# Patient Record
Sex: Male | Born: 1975 | Race: Black or African American | Hispanic: No | Marital: Single | Smoking: Former smoker
Health system: Southern US, Community
[De-identification: ages and names within clinical notes are randomized; demographics above are authoritative.]

## PROBLEM LIST (undated history)

## (undated) ENCOUNTER — Ambulatory Visit: Admission: EM | Payer: Self-pay | Source: Home / Self Care

## (undated) DIAGNOSIS — K219 Gastro-esophageal reflux disease without esophagitis: Secondary | ICD-10-CM

## (undated) DIAGNOSIS — I10 Essential (primary) hypertension: Secondary | ICD-10-CM

## (undated) DIAGNOSIS — Z8711 Personal history of peptic ulcer disease: Secondary | ICD-10-CM

## (undated) DIAGNOSIS — R42 Dizziness and giddiness: Secondary | ICD-10-CM

## (undated) DIAGNOSIS — R0600 Dyspnea, unspecified: Secondary | ICD-10-CM

## (undated) HISTORY — DX: Dizziness and giddiness: R42

## (undated) HISTORY — DX: Gastro-esophageal reflux disease without esophagitis: K21.9

## (undated) HISTORY — PX: MANDIBLE FRACTURE SURGERY: SHX706

## (undated) HISTORY — DX: Essential (primary) hypertension: I10

## (undated) HISTORY — DX: Dyspnea, unspecified: R06.00

## (undated) HISTORY — DX: Personal history of peptic ulcer disease: Z87.11

## (undated) HISTORY — PX: FRACTURE SURGERY: SHX138

---

## 2001-08-22 ENCOUNTER — Emergency Department (HOSPITAL_COMMUNITY): Admission: EM | Admit: 2001-08-22 | Discharge: 2001-08-22 | Payer: Self-pay | Admitting: Emergency Medicine

## 2002-10-19 ENCOUNTER — Emergency Department (HOSPITAL_COMMUNITY): Admission: EM | Admit: 2002-10-19 | Discharge: 2002-10-19 | Payer: Self-pay

## 2004-08-22 ENCOUNTER — Emergency Department (HOSPITAL_COMMUNITY): Admission: EM | Admit: 2004-08-22 | Discharge: 2004-08-22 | Payer: Self-pay | Admitting: Emergency Medicine

## 2004-10-31 ENCOUNTER — Ambulatory Visit: Payer: Self-pay | Admitting: Internal Medicine

## 2005-04-27 ENCOUNTER — Emergency Department (HOSPITAL_COMMUNITY): Admission: EM | Admit: 2005-04-27 | Discharge: 2005-04-27 | Payer: Self-pay | Admitting: Family Medicine

## 2008-03-25 ENCOUNTER — Emergency Department (HOSPITAL_COMMUNITY): Admission: EM | Admit: 2008-03-25 | Discharge: 2008-03-25 | Payer: Self-pay | Admitting: Emergency Medicine

## 2009-02-26 ENCOUNTER — Emergency Department (HOSPITAL_COMMUNITY): Admission: EM | Admit: 2009-02-26 | Discharge: 2009-02-26 | Payer: Self-pay | Admitting: Emergency Medicine

## 2009-09-04 ENCOUNTER — Emergency Department (HOSPITAL_COMMUNITY): Admission: EM | Admit: 2009-09-04 | Discharge: 2009-09-04 | Payer: Self-pay | Admitting: Family Medicine

## 2010-01-29 ENCOUNTER — Ambulatory Visit: Payer: Self-pay | Admitting: Internal Medicine

## 2010-01-29 ENCOUNTER — Observation Stay (HOSPITAL_COMMUNITY): Admission: EM | Admit: 2010-01-29 | Discharge: 2010-01-31 | Payer: Self-pay | Admitting: Emergency Medicine

## 2010-01-30 ENCOUNTER — Encounter (INDEPENDENT_AMBULATORY_CARE_PROVIDER_SITE_OTHER): Payer: Self-pay | Admitting: Internal Medicine

## 2010-02-06 ENCOUNTER — Telehealth (INDEPENDENT_AMBULATORY_CARE_PROVIDER_SITE_OTHER): Payer: Self-pay | Admitting: *Deleted

## 2010-02-07 ENCOUNTER — Ambulatory Visit: Payer: Self-pay | Admitting: Cardiology

## 2010-02-07 ENCOUNTER — Ambulatory Visit: Payer: Self-pay

## 2010-02-07 ENCOUNTER — Encounter (HOSPITAL_COMMUNITY): Admission: RE | Admit: 2010-02-07 | Discharge: 2010-04-12 | Payer: Self-pay | Admitting: Internal Medicine

## 2010-02-16 ENCOUNTER — Encounter: Payer: Self-pay | Admitting: Internal Medicine

## 2010-02-28 ENCOUNTER — Telehealth: Payer: Self-pay | Admitting: Internal Medicine

## 2010-03-07 ENCOUNTER — Ambulatory Visit: Payer: Self-pay | Admitting: Internal Medicine

## 2010-03-07 ENCOUNTER — Encounter (INDEPENDENT_AMBULATORY_CARE_PROVIDER_SITE_OTHER): Payer: Self-pay | Admitting: Nurse Practitioner

## 2010-03-07 DIAGNOSIS — I10 Essential (primary) hypertension: Secondary | ICD-10-CM | POA: Insufficient documentation

## 2010-03-07 DIAGNOSIS — R0602 Shortness of breath: Secondary | ICD-10-CM | POA: Insufficient documentation

## 2010-03-07 DIAGNOSIS — R079 Chest pain, unspecified: Secondary | ICD-10-CM | POA: Insufficient documentation

## 2010-03-09 ENCOUNTER — Ambulatory Visit: Payer: Self-pay | Admitting: Internal Medicine

## 2010-04-05 ENCOUNTER — Encounter (INDEPENDENT_AMBULATORY_CARE_PROVIDER_SITE_OTHER): Payer: Self-pay | Admitting: *Deleted

## 2010-04-19 ENCOUNTER — Encounter (INDEPENDENT_AMBULATORY_CARE_PROVIDER_SITE_OTHER): Payer: Self-pay | Admitting: *Deleted

## 2010-04-25 ENCOUNTER — Emergency Department (HOSPITAL_COMMUNITY): Admission: EM | Admit: 2010-04-25 | Discharge: 2010-04-25 | Payer: Self-pay | Admitting: Emergency Medicine

## 2010-05-18 ENCOUNTER — Encounter (INDEPENDENT_AMBULATORY_CARE_PROVIDER_SITE_OTHER): Payer: Self-pay | Admitting: *Deleted

## 2010-05-19 ENCOUNTER — Encounter (INDEPENDENT_AMBULATORY_CARE_PROVIDER_SITE_OTHER): Payer: Self-pay | Admitting: *Deleted

## 2010-07-19 ENCOUNTER — Encounter: Payer: Self-pay | Admitting: Internal Medicine

## 2010-07-19 ENCOUNTER — Ambulatory Visit (HOSPITAL_COMMUNITY): Admission: RE | Admit: 2010-07-19 | Discharge: 2010-07-19 | Payer: Self-pay | Admitting: Internal Medicine

## 2010-07-19 ENCOUNTER — Ambulatory Visit: Payer: Self-pay | Admitting: Cardiovascular Disease

## 2010-07-19 ENCOUNTER — Ambulatory Visit: Payer: Self-pay

## 2010-07-19 ENCOUNTER — Ambulatory Visit: Payer: Self-pay | Admitting: Internal Medicine

## 2010-08-11 ENCOUNTER — Emergency Department (HOSPITAL_COMMUNITY): Admission: EM | Admit: 2010-08-11 | Discharge: 2010-08-12 | Payer: Self-pay | Admitting: Emergency Medicine

## 2010-08-17 ENCOUNTER — Ambulatory Visit (HOSPITAL_BASED_OUTPATIENT_CLINIC_OR_DEPARTMENT_OTHER): Admission: RE | Admit: 2010-08-17 | Discharge: 2010-08-17 | Payer: Self-pay | Admitting: Orthopedic Surgery

## 2010-08-28 ENCOUNTER — Encounter
Admission: RE | Admit: 2010-08-28 | Discharge: 2010-10-17 | Payer: Self-pay | Source: Home / Self Care | Admitting: Orthopedic Surgery

## 2010-11-24 ENCOUNTER — Emergency Department (HOSPITAL_COMMUNITY)
Admission: EM | Admit: 2010-11-24 | Discharge: 2010-11-24 | Payer: Self-pay | Source: Home / Self Care | Admitting: Emergency Medicine

## 2010-12-12 NOTE — Letter (Signed)
Summary: Appointment - Reschedule  [facility]     [place], [place]    Phone:   Fax:      Apr 05, 2010 MRN: [id number]   Carl Greene [address] [place], [place]  [address]   Dear Mr. Carl Greene,   Due to a change in our office schedule, your appointment on  07-03-2010  at 9:30           must be changed.  It is very important that we reach you to reschedule this appointment. We look forward to participating in your health care needs. Please contact us at the number listed above at your earliest convenience to reschedule this appointment.     Sincerely,      Lorne Skeens  [facility] Scheduling Team

## 2010-12-12 NOTE — Assessment & Plan Note (Signed)
Summary: [facility] F/U   Vital Signs:  Patient profile:   35 year old male Height:      66.50 inches Weight:      168 pounds BMI:     26.81 BSA:     1.87 Temp:     97.8 degrees F oral Pulse rate:   68 / minute Pulse rhythm:   regular Resp:     20 per minute BP sitting:   131 / 95  (left arm) Cuff size:   regular  Vitals Entered By: Levon Hedger (March 07, 2010 11:46 AM) CC: new establish/ [facility] hospital follow up....experienced some SOB after walking to work last week Is Patient Diabetic? No Pain Assessment Patient in pain? no       Does patient need assistance? Functional Status Self care Ambulation Normal   CC:  new establish/ [facility] hospital follow up....experienced some SOB after walking to work last week.  History of Present Illness:  Pt into the office for hospital f/u on January 29, 2010. missed f/u appt here on April 5th, 2011 Pt was admitted with Atypical Chest Pain - resolved, hypertension, tobacco use (full D/c reviewed) Previous Dx of htn but pt was NOT taking meds upon presentation to ER R/o for MI based on cardiac enzymes EKG suggestive of LVH seen by Dr. Gala Romney (cardiology) and echo done Pt has already been seen by [facility] as per discharge instructions no further chest pain since d/c    Habits & Providers  Alcohol-Tobacco-Diet     Alcohol drinks/day: 0     Tobacco Status: quit     Tobacco Counseling: to remain off tobacco products     Year Started: age 14  Exercise-Depression-Behavior     Drug Use: marijuanna  Allergies (verified): No Known Drug Allergies  Social History: Smoking Status:  quit Drug Use:  marijuanna  Review of Systems CV:  Denies chest pain or discomfort. Resp:  Denies cough. GI:  Denies abdominal pain, nausea, and vomiting.  Physical Exam  General:  alert.   Head:  bald head Lungs:  normal breath sounds.   Heart:  normal rate and regular rhythm.   Abdomen:  soft and non-tender.   Msk:  up to the  exam table Neurologic:  alert & oriented X3.   Skin:  color normal.   Psych:  Oriented X3.     Impression & Recommendations:  Problem # 1:  HYPERTENSION, BENIGN ESSENTIAL (ICD-401.1) will refill meds BP stable today His updated medication list for this problem includes:    Hydrochlorothiazide 25 Mg Tabs (Hydrochlorothiazide) .[name]... Take one (1) by mouth daily    Amlodipine Besylate 5 Mg Tabs (Amlodipine besylate) ..... One tablet by mouth daily  Problem # 2:  CHEST PAIN-UNSPECIFIED (ICD-786.50) hospital d/c reviewed pt to f/u with cardiology as scheduled  Complete Medication List: 1)  Aspirin 81 Mg Tbec (Aspirin) .... Take one tablet by mouth daily 2)  Hydrochlorothiazide 25 Mg Tabs (Hydrochlorothiazide) .... Take one (1) by mouth daily 3)  Amlodipine Besylate 5 Mg Tabs (Amlodipine besylate) .... One tablet by mouth daily   Patient Instructions: 1)  Keep your appointment with cardiology 2)  You will need to keep your appointment for an orange card - eligibility. 3)  Follow up in this office in June after you get your orange card. Prescriptions: HYDROCHLOROTHIAZIDE 25 MG TABS (HYDROCHLOROTHIAZIDE) Take one (1) by mouth daily  #30 x 1   Entered and Authorized by:   Lehman Prom FNP  Signed by:   Lehman Prom FNP on 03/07/2010   Method used:   Print then Give to Patient   RxID:   [id number] AMLODIPINE BESYLATE 5 MG TABS (AMLODIPINE BESYLATE) One tablet by mouth daily  #30 x 1   Entered and Authorized by:   Lehman Prom FNP   Signed by:   Lehman Prom FNP on 03/07/2010   Method used:   Print then Give to Patient   RxID:   [id number]    CXR  Procedure date:  01/29/2010  Findings:      no active disease  Echocardiogram  Procedure date:  01/30/2010  Findings:      normal LV size, normal systolic function 50-55%, wall motion normal, no regional wall motion abnormalities   CXR  Procedure date:  01/29/2010  Findings:      no active  disease  Echocardiogram  Procedure date:  01/30/2010  Findings:      normal LV size, normal systolic function 50-55%, wall motion normal, no regional wall motion abnormalities

## 2010-12-12 NOTE — Progress Notes (Signed)
Summary: returning call  Phone Note Call from Patient Call back at [phone]   Caller: Patient Reason for Call: Talk to Nurse Summary of Call: returning call.... received letter about test results Initial call taken by: Migdalia Dk,  February 28, 2010 9:35 AM  Follow-up for Phone Call        pt aware of results, f/u appt sch for 4/28 at 10:15 [facility], RN  February 28, 2010 10:51 AM

## 2010-12-12 NOTE — Assessment & Plan Note (Signed)
Summary: 9:00/ eph/ g   Visit Type:  Follow-up Primary Provider:  [facility]  CC:  no cardiac complaints.  History of Present Illness: Carl Greene is a 35 y/o male with h/o HTN and tobacco use.   Recently admitted to [facility] with atypcial CP. Ruled out for Mi with serial cardiac markers. ECHO EF 50-55% + LVH. F/U nuke study. walked 9:15 NO ECG changes. with EF 42% (probably underestimated) mild diaphragmatic attenuation. No ischemia.     Returns today to discuss the results of his test.  Feeling good. Back to all his activities without probelm. Working, Scientist, physiological. No further CP.  Taking meds as prescribed. No CP or SOB.  Quit smoking completely.   Current Medications (verified): 1)  Aspirin 81 Mg Tbec (Aspirin) .... Take One Tablet By Mouth Daily 2)  Hydrochlorothiazide 25 Mg Tabs (Hydrochlorothiazide) .... Take One (1) By Mouth Daily 3)  Amlodipine Besylate 5 Mg Tabs (Amlodipine Besylate) .... One Tablet By Mouth Daily  Allergies (verified): No Known Drug Allergies  Past History:  Past Medical History: 1. Chest pain    --ECHO EF 50-55%   --Nuke study 4/11: Walked 9:15 no ecg changes. EF 42%? Inferior attenuation no scar 2. Dyspnea 3. Lightheadedness w/ CP 4. HTN 5. Tobacoo use - quit 4/11  Review of Systems       As per HPI and past medical history; otherwise all systems negative.   Vital Signs:  Patient profile:   35 year old male Height:      68 inches Weight:      170 pounds BMI:     25.94 Pulse rate:   70 / minute BP sitting:   136 / 86  (right arm) Cuff size:   regular  Vitals Entered By: Hardin Negus, RMA (March 09, 2010 9:27 AM)  Physical Exam  General:  ZOX:WRUE active. well appearing. no resp difficulty HEENT: normal Neck: supple. no JVD. Carotids 2+ bilat; no bruits. No lymphadenopathy or thryomegaly appreciated. Cor: PMI nondisplaced. Regular rate & rhythm. No rubs, gallops, murmur. Lungs: clear Abdomen: soft, nontender, nondistended. No  hepatosplenomegaly. No bruits or masses. Good bowel sounds. Extremities: no cyanosis, clubbing, rash, edema Neuro: alert & orientedx3, cranial nerves grossly intact. moves all 4 extremities w/o difficulty. affect pleasant    Impression & Recommendations:  Problem # 1:  CHEST PAIN-UNSPECIFIED (ICD-786.50) CP resolved. Currently asymptomatic. We reviewed results of echo and nuke stude at length. I suspect inferior defect on nuke study is attneuation artifact. No evidence of ischemia by test or symtpomatically. Discussed possiblitity of cath but I have advised watchful waiting. If feeling fine will repeat echo in 4 months and re-evalaute. If CP recures will plan cath.  Problem # 2:  Hypertension BP much improved. Continue current regimen.  Problem # 3:  Tobacco use. Congratulated on quitting.   Other Orders: Echocardiogram (Echo)  Patient Instructions: 1)  Your physician has requested that you have an echocardiogram.  Echocardiography is a painless test that uses sound waves to create images of your heart. It provides your doctor with information about the size and shape of your heart and how well your heart's chambers and valves are working.  This procedure takes approximately one hour. There are no restrictions for this procedure.  Needs in 4 months 2)  Follow up in 4 months Prescriptions: AMLODIPINE BESYLATE 5 MG TABS (AMLODIPINE BESYLATE) One tablet by mouth daily  #30 x 6   Entered by:   Meredith Staggers, RN   Authorized by:  Dolores Patty, MD, [facility]   Signed by:   Meredith Staggers, RN on 03/09/2010   Method used:   Electronically to        [facility] [phone]* (retail)       [address]       [place], [place]  [address]       Ph: [phone]       Fax: [phone]   RxID:   [id number] HYDROCHLOROTHIAZIDE 25 MG TABS (HYDROCHLOROTHIAZIDE) Take one (1) by mouth daily  #30 x 6   Entered by:   Meredith Staggers, RN   Authorized by:   Dolores Patty, MD, [facility]   Signed  by:   Meredith Staggers, RN on 03/09/2010   Method used:   Electronically to        [facility] [phone]* (retail)       [address]       [place], [place]  [address]       Ph: [phone]       Fax: [phone]   RxID:   [id number]   Prevention & Chronic Care Immunizations   Influenza vaccine: Not documented    Tetanus booster: Not documented    Pneumococcal vaccine: Not documented  Other Screening   Smoking status: current  (03/07/2010)

## 2010-12-12 NOTE — Letter (Signed)
Summary: Appointment - Reschedule  [facility], Main Office  [address]   [place], [place] [address]   Phone: [phone]  Fax: [phone]     April 19, 2010 MRN: [id number]   Carl Greene [address] [place], [place]  [address]   Dear [name],   Due to a change in our office schedule, your appointment on  07/03/10 at 9:30 must be changed.  It is very important that we reach you to reschedule this appointment. We look forward to participating in your health care needs. Please contact us at the number listed above at your earliest convenience to reschedule this appointment.     Sincerely, [facility] Scheduling Team

## 2010-12-12 NOTE — Assessment & Plan Note (Signed)
Summary: Cardiolog Nuclear Study  Nuclear Med Background Indications for Stress Test: Evaluation for Ischemia, Post Hospital  Indications Comments: 01-29-10 CP (-) enzymes  History: Echo  History Comments: 03/11 ECHO EF 50-55% Daily Marajuana use  Symptoms: Chest Pain, Dizziness, DOE, Fatigue, Light-Headedness, SOB    Nuclear Pre-Procedure Cardiac Risk Factors: CVA, Hypertension, Smoker Caffeine/Decaff Intake: none NPO After: 10:00 PM Lungs: clear IV 0.9% NS with Angio Cath: 18g     IV Site: (R) AC IV Started by: Stanton Kidney EMT-P Chest Size (in) 40     Height (in): 68 Weight (lb): 172 BMI: 26.25 Tech Comments: This patient has a very abnormal EKG, Dr. Gala Romney consulted to be sure it wasn't a problem to stress this patient.   Nuclear Med Study 1 or 2 day study:  1 day     Stress Test Type:  Stress Reading MD:  Marca Ancona, MD     Referring MD:  D.Bensimhon Resting Radionuclide:  Technetium 75m Tetrofosmin     Resting Radionuclide Dose:  10.5 mCi  Stress Radionuclide:  Technetium 57m Tetrofosmin     Stress Radionuclide Dose:  32 mCi   Stress Protocol Exercise Time (min):  9:15 min     Max HR:  173 bpm     Predicted Max HR:  186 bpm  Max Systolic BP: 172 mm Hg     Percent Max HR:  93.01 %     METS: 10.50 Rate Pressure Product:  29528    Stress Test Technologist:  Milana Na EMT-P     Nuclear Technologist:  Burna Mortimer Deal RT-N  Rest Procedure  Myocardial perfusion imaging was performed at rest 45 minutes following the intravenous administration of Myoview Technetium 51m Tetrofosmin.  Stress Procedure  The patient exercised for 9:15. The patient stopped due to fatigue and denied any chest pain.  There were non specific ST-T wave changes.  Myoview was injected at peak exercise and myocardial perfusion imaging was performed after a brief delay.  QPS Raw Data Images:  Mild diaphragmatic attenuation.  Normal left ventricular size. Stress Images:  Basal inferior  perfusion defect Rest Images:  Basal inferior perfusion defect Subtraction (SDS):  Mild fixed basal inferior perfusion defect.  Transient Ischemic Dilatation:  1.03  (Normal <1.22)  Lung/Heart Ratio:  .24  (Normal <0.45)  Quantitative Gated Spect Images QGS EDV:  134 ml QGS ESV:  78 ml QGS EF:  42 % QGS cine images:  Mild global hypokinesis.    Overall Impression  Exercise Capacity: Good exercise capacity. BP Response: Normal blood pressure response. Clinical Symptoms: Fatigue, no chest pain.  ECG Impression: No significant ST segment change suggestive of ischemia. Overall Impression: Mild fixed basal inferior perfusion defect is likely diaphragmatic attenuation.  Suspect no ischemia or infarction.  Overall Impression Comments: Mild global LV systolic dysfunction suggestive of nonischemic cardiomyopathy.  Needs review of echo (echo reports EF 50-55%) and consideration of cardiac MRI.   Appended Document: Cardiolog Nuclear Study pls bring in for visit to discuss.  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

## 2010-12-12 NOTE — Assessment & Plan Note (Signed)
Summary: 4 MONTH ROV/SL   Primary Provider:  [facility]   History of Present Illness: Carl Greene is a 35 y/o male with h/o HTN and tobacco use.   Recently admitted to [facility] with atypcial CP. Ruled out for MI with serial cardiac markers. ECHO EF 50-55% + LVH. F/U nuke study. walked 9:15 NO ECG changes. with EF 42% (probably underestimated) mild diaphragmatic attenuation. No ischemia.     I saw him several months ago in f/u and was doing fine. We discussed cath but given that he was asymptomatic we decided to proceed with watchful waiting and possibly repeat echo.   Returns todayfor routine f/u.  Feeling good. Back to all his activities without probelm. Working, walking doing calesthenics. Has occasional mild CP. Back to smoking about 2 cigs/day.   Current Medications (verified): 1)  Aspirin 81 Mg Tbec (Aspirin) .... Take One Tablet By Mouth Daily 2)  Hydrochlorothiazide 25 Mg Tabs (Hydrochlorothiazide) .... Take One (1) By Mouth Daily 3)  Amlodipine Besylate 5 Mg Tabs (Amlodipine Besylate) .... One Tablet By Mouth Daily  Allergies: No Known Drug Allergies  Past History:  Past Medical History: Last updated: 03/09/2010 1. Chest pain    --ECHO EF 50-55%   --Nuke study 4/11: Walked 9:15 no ecg changes. EF 42%? Inferior attenuation no scar 2. Dyspnea 3. Lightheadedness w/ CP 4. HTN 5. Tobacoo use - quit 4/11  Review of Systems       As per HPI and past medical history; otherwise all systems negative.   Vital Signs:  Patient profile:   35 year old male Height:      68 inches Weight:      166 pounds Pulse rate:   60 / minute Pulse rhythm:   regular BP sitting:   132 / 84  (right arm)  Vitals Entered By: Jacquelin Hawking, CMA (July 19, 2010 4:20 PM)  Physical Exam  General:  thin active. well appearing. no resp difficulty HEENT: normal Neck: supple. no JVD. Carotids 2+ bilat; no bruits. No lymphadenopathy or thryomegaly appreciated. Cor: PMI nondisplaced. Regular rate &  rhythm. No rubs, gallops, murmur. Lungs: clear Abdomen: soft, nontender, nondistended. No hepatosplenomegaly. No bruits or masses. Good bowel sounds. Extremities: no cyanosis, clubbing, rash, edema Neuro: alert & orientedx3, cranial nerves grossly intact. moves all 4 extremities w/o difficulty. affect pleasant     Impression & Recommendations:  Problem # 1:  CHEST PAIN-UNSPECIFIED (ICD-786.50) I reviewed echo today EF 60%. No wall motion abnormalities. Doub significant cardiac issue here. Counseled him on need to stop smoking. F/u with PCP. Return as needed.  Problem # 2:  Tobacco use Counseled on need to stop smoking.  Patient Instructions: 1)  Your physician wants you to follow-up in:  9 months.  You will receive a reminder letter in the mail two months in advance. If you don't receive a letter, please call our office to schedule the follow-up appointment.

## 2010-12-12 NOTE — Letter (Signed)
Summary: Appointment - Reschedule  [facility]     [place], [place]    Phone:   Fax:      May 18, 2010 MRN: [id number]   AKIEM URIETA [address] [place], [place]  [address]   Dear Mr. PELZER,   Due to a change in our office schedule, your appointment on 07-03-2010  at 9:30             must be changed.  It is very important that we reach you to reschedule this appointment. We look forward to participating in your health care needs. Please contact us at the number listed above at your earliest convenience to reschedule this appointment.     Sincerely,      Lorne Skeens  [facility] Scheduling Team

## 2010-12-12 NOTE — Letter (Signed)
Summary: test results  [facility], Main Office  [address]   [place], [place] [address]   Phone: [phone]  Fax: [phone]        February 16, 2010 MRN: [id number]    Carl Greene [address] [place], [place]  [address]    Dear [name],  We have been unable to reach you by phone regarding your test results.  Please give our office at your earliest convenience call to receive your results.     Sincerely,  Meredith Staggers, RN  This letter has been electronically signed by your physician.

## 2010-12-12 NOTE — Letter (Signed)
Summary: PT INFORMATION SHEET  PT INFORMATION SHEET   Imported By: Arta Bruce 05/02/2010 14:56:29  _____________________________________________________________________  External Attachment:    Type:   Image     Comment:   External Document

## 2010-12-12 NOTE — Progress Notes (Signed)
Summary: Nuclear Pre-Procedure  Phone Note Outgoing Call   Call placed by: Milana Na, EMT-P,  February 06, 2010 4:14 PM Summary of Call: Reviewed information on Myoview Information Sheet (see scanned document for further details).  No working numbers.     Nuclear Med Background Indications for Stress Test: Evaluation for Ischemia, Post Hospital  Indications Comments: 01-29-10 CP (-) enzymes  History: Echo  History Comments: 03/11 ECHO EF 50-55% Daily Marajuana use  Symptoms: Chest Pain    Nuclear Pre-Procedure Cardiac Risk Factors: CVA, Hypertension, Smoker  Nuclear Med Study Referring MD:  D.Bensimhon

## 2010-12-12 NOTE — Miscellaneous (Signed)
Summary: Appointment Canceled  Appointment status changed to canceled by [employer] on 05/19/2010 10:23 AM.  Cancellation Comments --------------------- ECHO DX 786.05/SELF PAY  Appointment Information ----------------------- Appt Type:  CARDIOLOGY ANCILLARY VISIT      Date:  Monday, July 03, 2010      Time:  8:30 AM for 60 min   Urgency:  Routine   Made By:  Hoy Finlay Scheduler  To Visit:  [id number]    Reason:  ECHO DX 786.05/SELF PAY  Appt Comments ------------- -- 05/19/10 10:23: ([facility]) CANCELED -- ECHO DX 786.05/SELF PAY -- 03/09/10 10:05: ([facility]) BOOKED -- Routine CARDIOLOGY ANCILLARY VISIT at 07/03/2010 8:30 AM for 60 min ECHO DX 786.05/SELF PAY                                                                           PT. CX RSC TO 07/29/10@ 3:00

## 2011-01-25 LAB — BASIC METABOLIC PANEL
BUN: 5 mg/dL — ABNORMAL LOW (ref 6–23)
CO2: 31 mEq/L (ref 19–32)
Calcium: 9.1 mg/dL (ref 8.4–10.5)
Chloride: 102 mEq/L (ref 96–112)
Creatinine, Ser: 0.82 mg/dL (ref 0.4–1.5)
GFR calc Af Amer: >60 mL/min (ref >60)
GFR calc non Af Amer: >60 mL/min (ref >60)
Glucose, Bld: 103 mg/dL — ABNORMAL HIGH (ref 70–99)
Potassium: 4.4 mEq/L (ref 3.5–5.1)
Sodium: 138 mEq/L (ref 135–145)

## 2011-01-25 LAB — POCT HEMOGLOBIN-HEMACUE: Hemoglobin: 14.2 g/dL (ref 13.0–17.0)

## 2011-01-29 LAB — POCT URINALYSIS DIP (DEVICE)
Bilirubin Urine: NEGATIVE
Glucose, UA: NEGATIVE mg/dL
Hgb urine dipstick: NEGATIVE
Ketones, ur: NEGATIVE mg/dL
Nitrite: NEGATIVE
Protein, ur: NEGATIVE mg/dL
Specific Gravity, Urine: 1.02 (ref 1.005–1.030)
Urobilinogen, UA: 0.2 mg/dL (ref 0.0–1.0)
pH: 7.5 (ref 5.0–8.0)

## 2011-02-05 LAB — POCT CARDIAC MARKERS
CKMB, poc: 1.2 ng/mL (ref 1.0–8.0)
CKMB, poc: <1 ng/mL — ABNORMAL LOW (ref 1.0–8.0)
Myoglobin, poc: 28.4 ng/mL (ref 12–200)
Myoglobin, poc: 43.2 ng/mL (ref 12–200)
Troponin i, poc: <0.05 ng/mL (ref 0.00–0.09)
Troponin i, poc: <0.05 ng/mL (ref 0.00–0.09)

## 2011-02-05 LAB — CARDIAC PANEL(CRET KIN+CKTOT+MB+TROPI)
CK, MB: 1.8 ng/mL (ref 0.3–4.0)
CK, MB: 1.9 ng/mL (ref 0.3–4.0)
CK, MB: 1.9 ng/mL (ref 0.3–4.0)
Relative Index: 1.2 (ref 0.0–2.5)
Relative Index: 1.2 (ref 0.0–2.5)
Relative Index: 1.5 (ref 0.0–2.5)
Total CK: 124 U/L (ref 7–232)
Total CK: 155 U/L (ref 7–232)
Total CK: 156 U/L (ref 7–232)
Troponin I: 0.05 ng/mL (ref 0.00–0.06)
Troponin I: <0.01 ng/mL (ref 0.00–0.06)
Troponin I: <0.01 ng/mL (ref 0.00–0.06)

## 2011-02-05 LAB — POCT I-STAT, CHEM 8
BUN: 8 mg/dL (ref 6–23)
Calcium, Ion: 1.08 mmol/L — ABNORMAL LOW (ref 1.12–1.32)
Chloride: 106 mEq/L (ref 96–112)
Creatinine, Ser: 0.8 mg/dL (ref 0.4–1.5)
Glucose, Bld: 87 mg/dL (ref 70–99)
HCT: 44 % (ref 39.0–52.0)
Hemoglobin: 15 g/dL (ref 13.0–17.0)
Potassium: 4.6 mEq/L (ref 3.5–5.1)
Sodium: 140 mEq/L (ref 135–145)
TCO2: 30 mmol/L (ref 0–100)

## 2011-02-05 LAB — CBC
HCT: 37.5 % — ABNORMAL LOW (ref 39.0–52.0)
HCT: 41.5 % (ref 39.0–52.0)
Hemoglobin: 12.1 g/dL — ABNORMAL LOW (ref 13.0–17.0)
Hemoglobin: 13.3 g/dL (ref 13.0–17.0)
MCHC: 32 g/dL (ref 30.0–36.0)
MCHC: 32.2 g/dL (ref 30.0–36.0)
MCV: 84.4 fL (ref 78.0–100.0)
MCV: 84.9 fL (ref 78.0–100.0)
Platelets: 182 10*3/uL (ref 150–400)
Platelets: 209 10*3/uL (ref 150–400)
RBC: 4.42 MIL/uL (ref 4.22–5.81)
RBC: 4.92 MIL/uL (ref 4.22–5.81)
RDW: 14.6 % (ref 11.5–15.5)
RDW: 15 % (ref 11.5–15.5)
WBC: 5.2 10*3/uL (ref 4.0–10.5)
WBC: 5.8 10*3/uL (ref 4.0–10.5)

## 2011-02-05 LAB — DIFFERENTIAL
Basophils Absolute: 0.1 10*3/uL (ref 0.0–0.1)
Basophils Relative: 1 % (ref 0–1)
Eosinophils Absolute: 0.1 10*3/uL (ref 0.0–0.7)
Eosinophils Relative: 3 % (ref 0–5)
Lymphocytes Relative: 39 % (ref 12–46)
Lymphs Abs: 2.3 10*3/uL (ref 0.7–4.0)
Monocytes Absolute: 0.5 10*3/uL (ref 0.1–1.0)
Monocytes Relative: 8 % (ref 3–12)
Neutro Abs: 2.8 10*3/uL (ref 1.7–7.7)
Neutrophils Relative %: 49 % (ref 43–77)

## 2011-02-05 LAB — CK TOTAL AND CKMB (NOT AT ARMC)
CK, MB: 1.9 ng/mL (ref 0.3–4.0)
Relative Index: 1.1 (ref 0.0–2.5)
Total CK: 167 U/L (ref 7–232)

## 2011-02-05 LAB — LIPID PANEL
Cholesterol: 144 mg/dL (ref 0–200)
HDL: 32 mg/dL — ABNORMAL LOW (ref >39)
LDL Cholesterol: 100 mg/dL — ABNORMAL HIGH (ref 0–99)
Total CHOL/HDL Ratio: 4.5 RATIO
Triglycerides: 59 mg/dL (ref <150)
VLDL: 12 mg/dL (ref 0–40)

## 2011-02-05 LAB — TSH: TSH: 1.655 u[IU]/mL (ref 0.350–4.500)

## 2011-02-05 LAB — BASIC METABOLIC PANEL
BUN: 5 mg/dL — ABNORMAL LOW (ref 6–23)
CO2: 27 mEq/L (ref 19–32)
Calcium: 8.5 mg/dL (ref 8.4–10.5)
Chloride: 109 mEq/L (ref 96–112)
Creatinine, Ser: 0.72 mg/dL (ref 0.4–1.5)
GFR calc Af Amer: >60 mL/min (ref >60)
GFR calc non Af Amer: >60 mL/min (ref >60)
Glucose, Bld: 96 mg/dL (ref 70–99)
Potassium: 3.9 mEq/L (ref 3.5–5.1)
Sodium: 140 mEq/L (ref 135–145)

## 2011-02-05 LAB — D-DIMER, QUANTITATIVE: D-Dimer, Quant: <0.22 ug/mL-FEU (ref 0.00–0.48)

## 2011-02-05 LAB — TROPONIN I: Troponin I: 0.01 ng/mL (ref 0.00–0.06)

## 2011-04-10 ENCOUNTER — Encounter: Payer: Self-pay | Admitting: Internal Medicine

## 2011-04-23 ENCOUNTER — Ambulatory Visit (INDEPENDENT_AMBULATORY_CARE_PROVIDER_SITE_OTHER): Payer: Self-pay | Admitting: Internal Medicine

## 2011-04-23 ENCOUNTER — Encounter: Payer: Self-pay | Admitting: Internal Medicine

## 2011-04-23 VITALS — BP 134/92 | HR 60 | Ht 68.0 in | Wt 173.0 lb

## 2011-04-23 DIAGNOSIS — F17200 Nicotine dependence, unspecified, uncomplicated: Secondary | ICD-10-CM | POA: Insufficient documentation

## 2011-04-23 DIAGNOSIS — E049 Nontoxic goiter, unspecified: Secondary | ICD-10-CM | POA: Insufficient documentation

## 2011-04-23 DIAGNOSIS — I10 Essential (primary) hypertension: Secondary | ICD-10-CM

## 2011-04-23 DIAGNOSIS — R079 Chest pain, unspecified: Secondary | ICD-10-CM

## 2011-04-23 NOTE — Progress Notes (Signed)
HPI:  Carl Greene is a 35 y/o male with h/o HTN and tobacco use.   Admitted to [facility] 3/11 with atypcial CP. Ruled out for Mi with serial cardiac markers. ECHO EF 50-55% + LVH. F/U nuke study. walked 9:15 NO ECG changes. with EF 42% (probably underestimated) mild diaphragmatic attenuation. No ischemia.     Had repeat echo in 9/11 EF 55-60%.   Had been feeling well but recently has been having tingling and tightness in arm, legs and back. Comes on when he feels lightheaded. If he stands up quickly also gets lightheaded. No playing sports. Appetite ok. Rare CP. No orthopnea or PND.  Not taking meds routinely.   Under a lot of stress. Fatigued. Feels somewhat depressed. Currently doesn't have a job.   Smoking a couple cigs per day.   ROS: All systems negative except as listed in HPI, PMH and Problem List.  Past Medical History  Diagnosis Date  . Chest pain     Echo EF 50-55%. Nuke study 4/11: walked 9:15 no ecg changes. EF 42%?  inferoir attenuation no scar.   [name] Dyspnea   . Lightheadedness     with CP  . HTN (hypertension)     Current Outpatient Prescriptions  Medication Sig Dispense Refill  . aspirin 81 MG tablet Take 81 mg by mouth daily.        [name] amLODipine (NORVASC) 5 MG tablet Take 5 mg by mouth daily.        . hydrochlorothiazide 25 MG tablet Take 25 mg by mouth daily.           PHYSICAL EXAM: Filed Vitals:   04/23/11 1431  BP: 134/92  Pulse: 60   General:  Well appearing. No resp difficulty HEENT: normal Neck: supple. JVP flat. Carotids 2+ bilaterally; no bruits. No lymphadenopathy or thryomegaly appreciated. Cor: PMI normal. Regular rate & rhythm. No rubs, gallops or murmurs. Lungs: clear Abdomen: soft, nontender, nondistended. No hepatosplenomegaly. No bruits or masses. Good bowel sounds. Extremities: no cyanosis, clubbing, rash, edema Neuro: alert & orientedx3, cranial nerves grossly intact. Moves all 4 extremities w/o difficulty. Affect pleasant.    ECG: NSR 60  +LVH Diffuse TWI (old)   ASSESSMENT & PLAN:

## 2011-04-23 NOTE — Patient Instructions (Signed)
Stop HCTZ Start Amlodipine 5 mg daily  Labs today (bmet, liver, cbc, tsh, free t4, free t3)  Thyroid Ultrasound for a goiter  Your physician wants you to follow-up in: 6 months.  You will receive a reminder letter in the mail two months in advance. If you don't receive a letter, please call our office to schedule the follow-up appointment.

## 2011-04-23 NOTE — Assessment & Plan Note (Signed)
Echo looks good. I doubt symptoms are cardiac at this point. I worry hi stress is a major issue.

## 2011-04-23 NOTE — Assessment & Plan Note (Signed)
Appears to have prominent goiter on exam. Check thyroid u/s and full TFTs.

## 2011-04-23 NOTE — Assessment & Plan Note (Signed)
BP mildly elevated. Restart amlodipine. D/c HCTZ given orthostatic sx.

## 2011-04-23 NOTE — Assessment & Plan Note (Signed)
Counseled on need to stop smoking.  

## 2011-04-24 LAB — HEPATIC FUNCTION PANEL
ALT: 22 U/L (ref 0–53)
AST: 25 U/L (ref 0–37)
Albumin: 4.6 g/dL (ref 3.5–5.2)
Alkaline Phosphatase: 77 U/L (ref 39–117)
Bilirubin, Direct: 0.2 mg/dL (ref 0.0–0.3)
Total Bilirubin: 0.4 mg/dL (ref 0.3–1.2)
Total Protein: 7.2 g/dL (ref 6.0–8.3)

## 2011-04-24 LAB — BASIC METABOLIC PANEL
BUN: 8 mg/dL (ref 6–23)
CO2: 26 mEq/L (ref 19–32)
Calcium: 9 mg/dL (ref 8.4–10.5)
Chloride: 107 mEq/L (ref 96–112)
Creatinine, Ser: 0.8 mg/dL (ref 0.4–1.5)
GFR: 136.73 mL/min (ref >60.00)
Glucose, Bld: 89 mg/dL (ref 70–99)
Potassium: 4.4 mEq/L (ref 3.5–5.1)
Sodium: 138 mEq/L (ref 135–145)

## 2011-04-24 LAB — CBC WITH DIFFERENTIAL/PLATELET
Eosinophils Relative: 2.3 % (ref 0.0–5.0)
HCT: 40.6 % (ref 39.0–52.0)
Hemoglobin: 13.3 g/dL (ref 13.0–17.0)
Lymphs Abs: 2.5 10*3/uL (ref 0.7–4.0)
MCV: 85 fl (ref 78.0–100.0)
Monocytes Absolute: 0.3 10*3/uL (ref 0.1–1.0)
Monocytes Relative: 6.4 % (ref 3.0–12.0)
Neutro Abs: 2.3 10*3/uL (ref 1.4–7.7)
Platelets: 204 10*3/uL (ref 150.0–400.0)
RDW: 16.2 % — ABNORMAL HIGH (ref 11.5–14.6)
WBC: 5.2 10*3/uL (ref 4.5–10.5)

## 2011-04-24 LAB — T3, FREE: T3, Free: 2.9 pg/mL (ref 2.3–4.2)

## 2011-04-26 ENCOUNTER — Ambulatory Visit (HOSPITAL_COMMUNITY)
Admission: RE | Admit: 2011-04-26 | Discharge: 2011-04-26 | Disposition: A | Discharge status: Home / Self Care | Payer: Self-pay | Source: Ambulatory Visit | Attending: Internal Medicine | Admitting: Internal Medicine

## 2011-04-26 DIAGNOSIS — E049 Nontoxic goiter, unspecified: Secondary | ICD-10-CM | POA: Insufficient documentation

## 2011-05-08 ENCOUNTER — Telehealth: Payer: Self-pay | Admitting: Internal Medicine

## 2011-05-08 NOTE — Telephone Encounter (Signed)
Pt calling for ECHO results--cell phone will not take anymore messages--called pt's home number and spoke to pt's grandmother and asked her to tell pt heart test normal--grandmother agrees--nt

## 2011-05-08 NOTE — Telephone Encounter (Signed)
Test result

## 2011-05-09 NOTE — Telephone Encounter (Signed)
Pt given results of thyroid ultrasound

## 2011-08-13 ENCOUNTER — Other Ambulatory Visit: Payer: Self-pay | Admitting: Internal Medicine

## 2011-12-20 ENCOUNTER — Other Ambulatory Visit (HOSPITAL_COMMUNITY): Payer: Self-pay | Admitting: Internal Medicine

## 2012-06-23 ENCOUNTER — Encounter (HOSPITAL_COMMUNITY): Payer: Self-pay

## 2012-06-23 ENCOUNTER — Emergency Department (INDEPENDENT_AMBULATORY_CARE_PROVIDER_SITE_OTHER)
Admission: EM | Admit: 2012-06-23 | Discharge: 2012-06-23 | Disposition: A | Discharge status: Home / Self Care | Payer: Self-pay | Source: Home / Self Care | Attending: Emergency Medicine | Admitting: Emergency Medicine

## 2012-06-23 DIAGNOSIS — Z202 Contact with and (suspected) exposure to infections with a predominantly sexual mode of transmission: Secondary | ICD-10-CM

## 2012-06-23 DIAGNOSIS — Z9189 Other specified personal risk factors, not elsewhere classified: Secondary | ICD-10-CM

## 2012-06-23 DIAGNOSIS — I10 Essential (primary) hypertension: Secondary | ICD-10-CM

## 2012-06-23 DIAGNOSIS — R079 Chest pain, unspecified: Secondary | ICD-10-CM

## 2012-06-23 MED ORDER — AMLODIPINE BESYLATE 5 MG PO TABS: 5.0000 mg | ORAL_TABLET | Freq: Every day | ORAL | Status: DC

## 2012-06-23 NOTE — ED Provider Notes (Signed)
Chief Complaint  Patient presents with  . Follow-up    History of Present Illness:   The patient is a 36 year old male who comes in today for STD testing. He recently found out that his partner was diagnosed with herpes. He wants to be checked for HSV 1 and 2 and HIV. He denies any lesions of the penis, urethral discharge, or dysuria. He also has had high blood pressure for several years. He's been off of his meds since March. He has been on Norvasc 5 mg per day. Denies headaches, dizziness, blurry vision, chest pain, or shortness of breath. Dr. Milas Kocher is his doctor. He was hospitalized at [facility] under his care in 2011 and underwent a complete cardiac workup which was negative. He has had an EKG in the past which showed nonspecific T wave changes. He was also told that his thyroid was enlarged but on further testing this did not require any further treatment. He denies any recent chest pain, but he states he had a couple episodes last month. These lasted for a few minutes at a time. They were not exertional and not associated with shortness of breath, nausea, or diaphoresis.  Review of Systems:  Other than noted above, the patient denies any of the following symptoms: Systemic:  No fevers chills, aches, weight loss, arthralgias, myalgias, or adenopathy. GI:  No abdominal pain, nausea or vomiting. GU:  No dysuria, penile pain, discharge, itching, dysuria, genital lesions, testicular pain or swelling. Skin:  No rash or itching.  PMFSH:  Past medical history, family history, social history, meds, and allergies were reviewed.  Physical Exam:   Vital signs:  BP 145/95  Pulse 86  Temp 98.5 F (36.9 C) (Oral)  Resp 16  SpO2 100% Gen:  Alert, oriented, in no distress. Lungs: Clear to auscultation. Heart: Regular rhythm, no gallops or murmurs. Abdomen:  Soft and flat, non-distended, and non-tender.  No organomegaly or mass. Genital:  No lesions on the penis, no urethral discharge, testes were  normal. Skin:  Warm and dry.  No rash.    Date: 06/23/2012  Rate: 68  Rhythm: normal sinus rhythm  QRS Axis: normal  Intervals: normal  ST/T Wave abnormalities: nonspecific T wave changes  Conduction Disutrbances:none  Narrative Interpretation: Normal sinus rhythm, nonspecific T wave abnormality, prolonged QT interval. Reviewing his previous EKG from 2012 shows no changes.  Old EKG Reviewed: unchanged  Other Labs Obtained at Urgent Care Center:  GC and Chlamydia DNA probe was obtained as well as serologies for HIV, RPR and herpes simplex virus type I and 2.  Results are pending at this time and we will call about any positive results.  Assessment:  The primary encounter diagnosis was Potential exposure to STD. Diagnoses of HYPERTENSION, BENIGN ESSENTIAL and CHEST PAIN-UNSPECIFIED were also pertinent to this visit. His chest pain is atypical for cardiac disease, his EKG is unchanged, and he had a previous cardiac workup which was negative. I do not think that the chest pain is cardiac in nature.  Plan:   1.  The following meds were prescribed:   New Prescriptions   AMLODIPINE (NORVASC) 5 MG TABLET    Take 1 tablet (5 mg total) by mouth daily.   2.  The patient was instructed in symptomatic care and handouts were given. 3.  The patient was told to return if becoming worse in any way, if no better in 3 or 4 days, and given some red flag symptoms that would indicate earlier return. 4.  The patient was instructed to inform all sexual contacts, avoid intercourse completely for 2 weeks and then only with a condom.  The patient was told that we would call about all abnormal lab results, and that we would need to report certain kinds of infection to the health department.    Reuben Likes, MD 06/23/12 2017

## 2012-06-23 NOTE — ED Notes (Signed)
Here for multiple issues; his "baby mama" has been told she has herpes, and he wants to be checked, also has not had an HIV screen done in a while and it's time to have that done again. Has not had his BP pills for a few months, as he is out of work, and could not afford them. Wants lesions on arms checked too. NAD

## 2012-06-24 ENCOUNTER — Telehealth (HOSPITAL_COMMUNITY): Payer: Self-pay | Admitting: *Deleted

## 2012-06-24 LAB — RPR: RPR Ser Ql: NONREACTIVE

## 2012-06-24 LAB — GC/CHLAMYDIA PROBE AMP, GENITAL: Chlamydia, DNA Probe: NEGATIVE

## 2012-06-24 LAB — HIV ANTIBODY (ROUTINE TESTING W REFLEX): HIV: NONREACTIVE

## 2012-06-24 LAB — HSV 2 ANTIBODY, IGG: HSV 2 Glycoprotein G Ab, IgG: 8.08 IV — ABNORMAL HIGH

## 2012-06-24 NOTE — ED Notes (Signed)
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 I called and left a message to call on cell and home numbers. Carl Greene 06/24/2012

## 2012-06-24 NOTE — ED Notes (Addendum)
Pt. returned my call.  Pt. verified x 2 and given results. 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  Pt. States he has never had a sore but has had a rash with some itching. Pt. instructed to notify his partner. You can pass the virus even when you don't have an outbreak, so always practice safe sex. Get treated for each outbreak with Acyclovir or Valtrex. You may want to get a primary care doctor who can give you medication to help prevent outbreaks or who can call in a prescription for you when you have an outbreak.  Pt. voiced understanding.  Pt. Concerned about a pea sized cys on his posterior chest. It has been there for a few months and is not painful.  Discussed with Dr. Lorenz Coaster and he said to tell him to just leave it alone. Pt. given this information.  I told him if it gets red swollen painful or draining to get it checked. Vassie Moselle 06/24/2012

## 2013-03-18 ENCOUNTER — Emergency Department (HOSPITAL_COMMUNITY)
Admission: EM | Admit: 2013-03-18 | Discharge: 2013-03-18 | Disposition: A | Discharge status: Home / Self Care | Payer: Self-pay | Attending: Emergency Medicine | Admitting: Emergency Medicine

## 2013-03-18 ENCOUNTER — Encounter (HOSPITAL_COMMUNITY): Payer: Self-pay | Admitting: Family Medicine

## 2013-03-18 ENCOUNTER — Emergency Department (HOSPITAL_COMMUNITY): Payer: Self-pay

## 2013-03-18 DIAGNOSIS — R51 Headache: Secondary | ICD-10-CM | POA: Insufficient documentation

## 2013-03-18 DIAGNOSIS — F17200 Nicotine dependence, unspecified, uncomplicated: Secondary | ICD-10-CM | POA: Insufficient documentation

## 2013-03-18 DIAGNOSIS — I10 Essential (primary) hypertension: Secondary | ICD-10-CM | POA: Insufficient documentation

## 2013-03-18 DIAGNOSIS — Z79899 Other long term (current) drug therapy: Secondary | ICD-10-CM | POA: Insufficient documentation

## 2013-03-18 DIAGNOSIS — Z7982 Long term (current) use of aspirin: Secondary | ICD-10-CM | POA: Insufficient documentation

## 2013-03-18 DIAGNOSIS — R110 Nausea: Secondary | ICD-10-CM | POA: Insufficient documentation

## 2013-03-18 DIAGNOSIS — R079 Chest pain, unspecified: Secondary | ICD-10-CM | POA: Insufficient documentation

## 2013-03-18 LAB — CBC
HCT: 39.9 % (ref 39.0–52.0)
Hemoglobin: 14.3 g/dL (ref 13.0–17.0)
MCH: 27.6 pg (ref 26.0–34.0)
MCHC: 35.8 g/dL (ref 30.0–36.0)
RDW: 14.5 % (ref 11.5–15.5)

## 2013-03-18 LAB — URINALYSIS, ROUTINE W REFLEX MICROSCOPIC
Bilirubin Urine: NEGATIVE
Hgb urine dipstick: NEGATIVE
Ketones, ur: NEGATIVE mg/dL
Nitrite: NEGATIVE
Protein, ur: NEGATIVE mg/dL
Specific Gravity, Urine: 1.012 (ref 1.005–1.030)
Urobilinogen, UA: 0.2 mg/dL (ref 0.0–1.0)

## 2013-03-18 LAB — BASIC METABOLIC PANEL
BUN: 8 mg/dL (ref 6–23)
Calcium: 9.4 mg/dL (ref 8.4–10.5)
Creatinine, Ser: 0.78 mg/dL (ref 0.50–1.35)
GFR calc Af Amer: >90 mL/min (ref >90)
GFR calc non Af Amer: >90 mL/min (ref >90)
Glucose, Bld: 112 mg/dL — ABNORMAL HIGH (ref 70–99)

## 2013-03-18 MED ORDER — METOCLOPRAMIDE HCL 5 MG/ML IJ SOLN
10.0000 mg | Freq: Once | INTRAMUSCULAR | Status: AC
  Administered 2013-03-18: 10 mg via INTRAVENOUS
  Filled 2013-03-18: qty 2

## 2013-03-18 MED ORDER — AMLODIPINE BESYLATE 5 MG PO TABS: 5.0000 mg | ORAL_TABLET | Freq: Every day | ORAL | Status: DC

## 2013-03-18 MED ORDER — DIPHENHYDRAMINE HCL 50 MG/ML IJ SOLN
25.0000 mg | Freq: Once | INTRAMUSCULAR | Status: AC
  Administered 2013-03-18: 25 mg via INTRAVENOUS
  Filled 2013-03-18: qty 1

## 2013-03-18 MED ORDER — METOCLOPRAMIDE HCL 10 MG PO TABS: 10.0000 mg | ORAL_TABLET | Freq: Four times a day (QID) | ORAL | Status: DC | PRN

## 2013-03-18 NOTE — ED Provider Notes (Signed)
History     CSN: [id number]  Arrival date & time 03/18/13  5621   First MD Initiated Contact with Patient 03/18/13 [phone]      Chief Complaint  Patient presents with  . Hypertension    (Consider location/radiation/quality/duration/timing/severity/associated sxs/prior treatment) Patient is a 37 y.o. male presenting with hypertension. The history is provided by the patient.  Hypertension  He says that he ran out of his blood pressure medicine-and moderate pain-about 5 weeks ago. Over the last 2 weeks, he has been having intermittent problems with bitemporal headache and intermittent right-sided chest pain. Headache is throbbing and worse with exposure to light or noise. There is associated nausea but no vomiting. Has not had any visual change. Headache is moderate and he puts pain currently at 4/5 although he has occasionally been awakened by his headache. Chest pain is not associated with exertion and is fleeting. He denies dyspnea or diaphoresis. He does not check his blood pressure at home. He still smokes 2 cigarettes a day. He has not done anything to help his headache or chest pain.  Past Medical History  Diagnosis Date  . Chest pain     Echo EF 50-55%. [facility] study 4/11: walked 9:15 no ecg changes. EF 42%?  inferoir attenuation no scar.   [name] Dyspnea   . Lightheadedness     with CP  . HTN (hypertension)     History reviewed. No pertinent past surgical history.  Family History  Problem Relation Age of Onset  . Coronary artery disease      family history of it.   . Hypertension      family history of it.     History  Substance Use Topics  . Smoking status: Current Every Day Smoker -- 0.50 packs/day    Types: Cigarettes  . Smokeless tobacco: Former Neurosurgeon     Comment: quit 4/11.  Young Berry  . Alcohol Use: No      Review of Systems  All other systems reviewed and are negative.    Allergies  Review of patient's allergies indicates no known allergies.  Home  Medications   Current Outpatient Rx  Name  Route  Sig  Dispense  Refill  . amLODipine (NORVASC) 5 MG tablet      take 1 tablet by mouth once daily   30 tablet   6   . amLODipine (NORVASC) 5 MG tablet   Oral   Take 1 tablet (5 mg total) by mouth daily.   30 tablet   2   . aspirin 81 MG tablet   Oral   Take 81 mg by mouth daily.             BP 179/105  Pulse 66  Temp(Src) 98.3 F (36.8 C)  Resp 18  SpO2 100%  Physical Exam  Nursing note and vitals reviewed.  37 year old male, resting comfortably and in no acute distress. Vital signs are significant for hypertension with blood pressure 179/105. Oxygen saturation is 100%, which is normal. Head is normocephalic and atraumatic. PERRLA, EOMI. Oropharynx is clear. Fundi show no hemorrhage, exudate, or papilledema. Neck is nontender and supple without adenopathy or JVD. Back is nontender and there is no CVA tenderness. Lungs are clear without rales, wheezes, or rhonchi. Chest is nontender. Heart has regular rate and rhythm without murmur. Abdomen is soft, flat, nontender without masses or hepatosplenomegaly and peristalsis is normoactive. Extremities have no cyanosis or edema, full range of motion is present. Skin is warm and  dry without rash. Neurologic: Mental status is normal, cranial nerves are intact, there are no motor or sensory deficits.  ED Course  Procedures (including critical care time)  Results for orders placed during the hospital encounter of 03/18/13  CBC      Result Value Range   WBC 8.8  4.0 - 10.5 K/uL   RBC 5.19  4.22 - 5.81 MIL/uL   Hemoglobin 14.3  13.0 - 17.0 g/dL   HCT 16.1  09.6 - 04.5 %   MCV 76.9 (*) 78.0 - 100.0 fL   MCH 27.6  26.0 - 34.0 pg   MCHC 35.8  30.0 - 36.0 g/dL   RDW 40.9  81.1 - 91.4 %   Platelets 228  150 - 400 K/uL  BASIC METABOLIC PANEL      Result Value Range   Sodium 137  135 - 145 mEq/L   Potassium 3.9  3.5 - 5.1 mEq/L   Chloride 103  96 - 112 mEq/L   CO2 27  19 -  32 mEq/L   Glucose, Bld 112 (*) 70 - 99 mg/dL   BUN 8  6 - 23 mg/dL   Creatinine, Ser 7.82  0.50 - 1.35 mg/dL   Calcium 9.4  8.4 - 95.6 mg/dL   GFR calc non Af Amer >90  >90 mL/min   GFR calc Af Amer >90  >90 mL/min  URINALYSIS, ROUTINE W REFLEX MICROSCOPIC      Result Value Range   Color, Urine YELLOW  YELLOW   APPearance CLEAR  CLEAR   Specific Gravity, Urine 1.012  1.005 - 1.030   pH 6.5  5.0 - 8.0   Glucose, UA NEGATIVE  NEGATIVE mg/dL   Hgb urine dipstick NEGATIVE  NEGATIVE   Bilirubin Urine NEGATIVE  NEGATIVE   Ketones, ur NEGATIVE  NEGATIVE mg/dL   Protein, ur NEGATIVE  NEGATIVE mg/dL   Urobilinogen, UA 0.2  0.0 - 1.0 mg/dL   Nitrite NEGATIVE  NEGATIVE   Leukocytes, UA NEGATIVE  NEGATIVE   Dg Chest 2 View  03/18/2013  *RADIOLOGY REPORT*  Clinical Data:  Hypertension, chest pain and dizziness.  CHEST - 2 VIEW  Comparison: 01/29/2010  Findings: The heart size and mediastinal contours are within normal limits.  Both lungs are clear.  The visualized skeletal structures are unremarkable.  IMPRESSION: No active disease.   Original Report Authenticated By: Irish Lack, M.D.    Ct Head Wo Contrast  03/18/2013  *RADIOLOGY REPORT*  Clinical Data: Headache, intermittent right arm numbness  CT HEAD WITHOUT CONTRAST  Technique:  Contiguous axial images were obtained from the base of the skull through the vertex without contrast.  Comparison: None.  Findings: Wallace Cullens white differentiation is maintained.  No CT evidence of acute large territory infarct.  No intraparenchymal or extra- axial mass or hemorrhage.  Normal size and configuration of the ventricles and basilar cisterns.  No midline shift.  Limited visualization of the paranasal sinuses and mastoid air cells are normal.  Regional soft tissues are normal.  No displaced calvarial fracture.  IMPRESSION: Negative noncontrast head CT.   Original Report Authenticated By: Tacey Ruiz, MD       Date: 03/18/2013  Rate: 62  Rhythm: normal sinus  rhythm  QRS Axis: normal  Intervals: normal  ST/T Wave abnormalities: nonspecific ST/T changes  Conduction Disutrbances:none  Narrative Interpretation:  Nonspecific ST and T changes which may be related to left ventricular hypertrophy. When compared with ECG of 06/23/2012, no significant changes are seen.  Old EKG Reviewed: unchanged    1. Headache   2. Hypertension       MDM  Headache which has characteristics suggestive of migraine variant. You or he treated with a migraine cocktail. Moderate to severe hypertension secondary to medication noncompliance. He will be sent for CT to make sure he has not had an intracerebral hemorrhage. Screening labs are obtained and I anticipate sending out with a new prescription for his amlodipine.  Headache is completely gone following migraine cocktail. Blood pressure has come down to 139/99 with no specific treatment for blood pressure. Patient is advised to monitor his blood pressure at home. Since she had been on amlodipine in the past, he is given a prescription for that and also sent him a prescription for metoclopramide.  Dione Booze, MD 03/18/13 1325

## 2013-03-18 NOTE — ED Notes (Signed)
Patient transported to X-ray 

## 2013-03-18 NOTE — ED Notes (Addendum)
Per pt woke up this am with HA. sts hasn't had BP meds in 1 month. sts usually feels like this when his BP is high. sts right arm intermittently numb and some nausea and dizziness. sts also he has been having intermittent chest pain.

## 2013-04-21 ENCOUNTER — Encounter: Payer: Self-pay | Admitting: *Deleted

## 2013-04-24 ENCOUNTER — Ambulatory Visit (INDEPENDENT_AMBULATORY_CARE_PROVIDER_SITE_OTHER): Payer: Self-pay | Admitting: Cardiology

## 2013-04-24 ENCOUNTER — Encounter: Payer: Self-pay | Admitting: Cardiology

## 2013-04-24 VITALS — BP 131/83 | HR 69 | Ht 68.0 in | Wt 175.0 lb

## 2013-04-24 DIAGNOSIS — I10 Essential (primary) hypertension: Secondary | ICD-10-CM

## 2013-04-24 DIAGNOSIS — E049 Nontoxic goiter, unspecified: Secondary | ICD-10-CM

## 2013-04-24 LAB — LIPID PANEL
LDL Cholesterol: 125 mg/dL — ABNORMAL HIGH (ref 0–99)
Total CHOL/HDL Ratio: 5
Triglycerides: 61 mg/dL (ref 0.0–149.0)

## 2013-04-24 LAB — TSH: TSH: 0.49 u[IU]/mL (ref 0.35–5.50)

## 2013-04-24 MED ORDER — AMLODIPINE BESYLATE 5 MG PO TABS: 5.0000 mg | ORAL_TABLET | Freq: Every day | ORAL | Status: DC

## 2013-04-24 NOTE — Patient Instructions (Addendum)
Your physician wants you to follow-up in: 6 months with the physician's assistant in our office and 12 months with Dr. Shirlee Latch. You will receive a reminder letter in the mail two months in advance. If you don't receive a letter, please call our office to schedule the follow-up appointment.  Check you blood pressure daily and keep a record of these readings.  Katina Dung, RN will call you in 2 weeks to get these readings.

## 2013-04-25 NOTE — Progress Notes (Signed)
Patient ID: Carl Greene, male   DOB: [birth year], 37 y.o.   MRN: [id number] 38 yo with history of HTN and prior atypical (noncardiac) chest pain presents for cardiac followup.  He has been seen by Dr. Gala Romney in the past and is seeing me for the first time today.  He went to the ER in 5/14 with a headache and chest pain.  He had been out of amlodipine for a number of days and his BP was very high.  He has restarted his amlodipine and is having no further problems.  He has not been checking his BP at home.  He denies exertional dyspnea or chest pain.  No further chest pain since going back on amlodipine.   ECG: NSR, LVH with repolarization abnormalities.   Labs (8/13): HIV negative Labs (5/14): K 3.9, creatinine 0.78  PMH: 1. HTN 2. Smoker: Quit 4/14.  3. Atypical chest pain: Echo (3/11) with EF 50-55%, LVH.  ETT-Myoview (3/11) with 9'15" exercise, EF 42% (visually higher), no ischemia.  Repeat echo (9/11) with EF 55-60%.  4. Goiter: [place] 2012 with benign-appearing cysts, mildly enlarged thyroid.   SH: Quit smoking 4/14.  Works for a [employer].  Lives in [place].   FH: Grandfather with CAD.  ROS: All systems reviewed and negative except as per HPI.   Current Outpatient Prescriptions  Medication Sig Dispense Refill  . amLODipine (NORVASC) 5 MG tablet Take 1 tablet (5 mg total) by mouth daily.  90 tablet  3  . aspirin 81 MG tablet Take 81 mg by mouth daily.        . metoCLOPramide (REGLAN) 10 MG tablet Take 1 tablet (10 mg total) by mouth every 6 (six) hours as needed (Nausea or headache).  30 tablet  0   No current facility-administered medications for this visit.    BP 131/83  Pulse 69  Ht 5\' 8"  (1.727 m)  Wt 175 lb (79.379 kg)  BMI 26.61 kg/m2 General: NAD Neck: No JVD, diffuse thyroid enlargement.  Lungs: Clear to auscultation bilaterally with normal respiratory effort. CV: Nondisplaced PMI.  Heart regular S1/S2, no S3/S4, no murmur.  No peripheral edema.  No carotid  bruit.  Normal pedal pulses.  Abdomen: Soft, nontender, no hepatosplenomegaly, no distention.  Neurologic: Alert and oriented x 3.  Psych: Normal affect. Extremities: No clubbing or cyanosis.   Assessment/Plan: 1. HTN: BP better controlled now on amlodipine.  He is going to check his BP daily and we will call in 2 wks to get his home readings.   2. Goiter: I will check TSH.  3. I will check lipids today.   Followup with PA in 6 months and me in a year.   Marca Ancona 04/25/2013 4:31 PM

## 2013-05-13 ENCOUNTER — Telehealth: Payer: Self-pay | Admitting: *Deleted

## 2013-05-13 NOTE — Telephone Encounter (Signed)
HTN: BP better controlled now on amlodipine. He is going to check his BP daily and we will call in 2 wks to get his home readings.     05/13/13 LMTCB

## 2013-05-26 NOTE — Telephone Encounter (Signed)
Spoke with patient. Pt states that due to his work schedule he has not been able to take his BP. He will make an effort to take and record his BP in the next 2 weeks and call back with the readings.

## 2013-06-23 ENCOUNTER — Emergency Department (HOSPITAL_COMMUNITY)
Admission: EM | Admit: 2013-06-23 | Discharge: 2013-06-23 | Disposition: A | Discharge status: Home / Self Care | Payer: Self-pay | Attending: Emergency Medicine | Admitting: Emergency Medicine

## 2013-06-23 ENCOUNTER — Emergency Department (HOSPITAL_COMMUNITY): Payer: Self-pay

## 2013-06-23 ENCOUNTER — Encounter (HOSPITAL_COMMUNITY): Payer: Self-pay | Admitting: *Deleted

## 2013-06-23 DIAGNOSIS — R51 Headache: Secondary | ICD-10-CM | POA: Insufficient documentation

## 2013-06-23 DIAGNOSIS — Z9119 Patient's noncompliance with other medical treatment and regimen: Secondary | ICD-10-CM | POA: Insufficient documentation

## 2013-06-23 DIAGNOSIS — H53149 Visual discomfort, unspecified: Secondary | ICD-10-CM | POA: Insufficient documentation

## 2013-06-23 DIAGNOSIS — R42 Dizziness and giddiness: Secondary | ICD-10-CM | POA: Insufficient documentation

## 2013-06-23 DIAGNOSIS — I10 Essential (primary) hypertension: Secondary | ICD-10-CM | POA: Insufficient documentation

## 2013-06-23 DIAGNOSIS — R209 Unspecified disturbances of skin sensation: Secondary | ICD-10-CM | POA: Insufficient documentation

## 2013-06-23 DIAGNOSIS — Z8781 Personal history of (healed) traumatic fracture: Secondary | ICD-10-CM | POA: Insufficient documentation

## 2013-06-23 DIAGNOSIS — Z91199 Patient's noncompliance with other medical treatment and regimen due to unspecified reason: Secondary | ICD-10-CM | POA: Insufficient documentation

## 2013-06-23 DIAGNOSIS — Z87891 Personal history of nicotine dependence: Secondary | ICD-10-CM | POA: Insufficient documentation

## 2013-06-23 LAB — DIFFERENTIAL
Basophils Absolute: 0.1 10*3/uL (ref 0.0–0.1)
Eosinophils Relative: 1 % (ref 0–5)
Lymphocytes Relative: 49 % — ABNORMAL HIGH (ref 12–46)
Lymphs Abs: 2 10*3/uL (ref 0.7–4.0)
Neutro Abs: 1.6 10*3/uL — ABNORMAL LOW (ref 1.7–7.7)

## 2013-06-23 LAB — COMPREHENSIVE METABOLIC PANEL
ALT: 19 U/L (ref 0–53)
AST: 18 U/L (ref 0–37)
Alkaline Phosphatase: 90 U/L (ref 39–117)
CO2: 30 mEq/L (ref 19–32)
Chloride: 102 mEq/L (ref 96–112)
GFR calc Af Amer: >90 mL/min (ref >90)
GFR calc non Af Amer: >90 mL/min (ref >90)
Glucose, Bld: 89 mg/dL (ref 70–99)
Potassium: 3.9 mEq/L (ref 3.5–5.1)
Sodium: 139 mEq/L (ref 135–145)
Total Bilirubin: 0.5 mg/dL (ref 0.3–1.2)

## 2013-06-23 LAB — CBC
MCV: 79.8 fL (ref 78.0–100.0)
Platelets: 205 10*3/uL (ref 150–400)
RBC: 5.44 MIL/uL (ref 4.22–5.81)
RDW: 14.1 % (ref 11.5–15.5)
WBC: 4.1 10*3/uL (ref 4.0–10.5)

## 2013-06-23 LAB — APTT: aPTT: 27 seconds (ref 24–37)

## 2013-06-23 LAB — POCT I-STAT TROPONIN I

## 2013-06-23 LAB — PROTIME-INR: Prothrombin Time: 13.3 seconds (ref 11.6–15.2)

## 2013-06-23 MED ORDER — IBUPROFEN 800 MG PO TABS: 800.0000 mg | ORAL_TABLET | Freq: Three times a day (TID) | ORAL | Status: DC

## 2013-06-23 MED ORDER — ONDANSETRON 4 MG PO TBDP: ORAL_TABLET | ORAL | Status: DC

## 2013-06-23 MED ORDER — IBUPROFEN 800 MG PO TABS
800.0000 mg | ORAL_TABLET | Freq: Once | ORAL | Status: AC
  Administered 2013-06-23: 800 mg via ORAL
  Filled 2013-06-23: qty 1

## 2013-06-23 MED ORDER — HYDROCHLOROTHIAZIDE 25 MG PO TABS: 25.0000 mg | ORAL_TABLET | Freq: Every day | ORAL | Status: DC

## 2013-06-23 NOTE — ED Notes (Signed)
Contacted [facility] for help with patients medication due to lack of insurance. Patient is eligible for obtaining his medication at [facility]. Instructions given to patient as well as the importance of taking his medications as prescribed.

## 2013-06-23 NOTE — ED Provider Notes (Signed)
CSN: [id number]     Arrival date & time 06/23/13  1320 History     First MD Initiated Contact with Patient 06/23/13 1512     Chief Complaint  Patient presents with  . Stroke Symptoms   (Consider location/radiation/quality/duration/timing/severity/associated sxs/prior Treatment) HPI Comments: 37 yo male with HTN, non compliant with medicines, left mandible fx surgery presents with left jaw pain/ numbness for a week.  He has had left sided ha, no injury.  No focal weakness.  NO stroke hx.  Bp always "high".  No cp or sob.  Constant.  Mild balance difficulty but walks okay. Intermittent.  Pt has had mild sxs similar in the past with htn.   The history is provided by the patient.    Past Medical History  Diagnosis Date  . Chest pain     Echo EF 50-55%. [facility] study 4/11: walked 9:15 no ecg changes. EF 42%?  inferoir attenuation no scar.   [name] Dyspnea   . Lightheadedness     with CP  . HTN (hypertension)    Past Surgical History  Procedure Laterality Date  . Fracture surgery    . Mandible fracture surgery     Family History  Problem Relation Age of Onset  . Coronary artery disease Maternal Grandfather   . Hypertension Mother    History  Substance Use Topics  . Smoking status: Former Smoker -- 0.50 packs/day    Types: Cigarettes    Quit date: 03/12/2013  . Smokeless tobacco: Former Neurosurgeon     Comment: quit 4/11.  Young Berry. pt quit again 03/11/2013  . Alcohol Use: No    Review of Systems  Constitutional: Negative for fever and chills.  HENT: Negative for neck pain and neck stiffness.   Eyes: Positive for photophobia. Negative for visual disturbance.  Respiratory: Negative for shortness of breath.   Cardiovascular: Negative for chest pain.  Gastrointestinal: Negative for vomiting and abdominal pain.  Genitourinary: Negative for dysuria and flank pain.  Musculoskeletal: Negative for back pain.  Skin: Negative for rash.  Neurological: Positive for dizziness, light-headedness  and numbness. Negative for headaches.    Allergies  Review of patient's allergies indicates no known allergies.  Home Medications   Current Outpatient Rx  Name  Route  Sig  Dispense  Refill  . aspirin 325 MG tablet   Oral   Take 325 mg by mouth daily as needed for pain (for headache.).         [name] hydrochlorothiazide (HYDRODIURIL) 25 MG tablet   Oral   Take 1 tablet (25 mg total) by mouth daily.   30 tablet   0   . ibuprofen (ADVIL,MOTRIN) 800 MG tablet   Oral   Take 1 tablet (800 mg total) by mouth 3 (three) times daily.   10 tablet   0   . ondansetron (ZOFRAN ODT) 4 MG disintegrating tablet      4mg  ODT q4 hours prn nausea/vomit   4 tablet   0    BP 135/100  Pulse 67  Temp(Src) 98.6 F (37 C) (Oral)  Resp 18  SpO2 98% Physical Exam  Nursing note and vitals reviewed. Constitutional: He is oriented to person, place, and time. He appears well-developed and well-nourished.  HENT:  Head: Normocephalic and atraumatic.  Eyes: Conjunctivae are normal. Right eye exhibits no discharge. Left eye exhibits no discharge.  Neck: Normal range of motion. Neck supple. No tracheal deviation present.  Cardiovascular: Normal rate and regular rhythm.   Pulmonary/Chest: Effort normal  and breath sounds normal.  Abdominal: Soft. He exhibits no distension. There is no tenderness. There is no guarding.  Musculoskeletal: He exhibits tenderness (left TMJ). He exhibits no edema.  Neurological: He is alert and oriented to person, place, and time. He displays no tremor. No cranial nerve deficit. Gait normal. GCS eye subscore is 4. GCS verbal subscore is 5. GCS motor subscore is 6.  5+ strength in UE and LE with f/e at major joints. Sensation to palpation intact in UE and LE. CNs 2-12 grossly intact.  EOMFI.  PERRL.   Finger nose and coordination intact bilateral.   Visual fields intact to finger testing.   Skin: Skin is warm. No rash noted.  Psychiatric: He has a normal mood and affect.     ED Course   Procedures (including critical care time)  Labs Reviewed  DIFFERENTIAL - Abnormal; Notable for the following:    Neutrophils Relative % 39 (*)    Neutro Abs 1.6 (*)    Lymphocytes Relative 49 (*)    All other components within normal limits  PROTIME-INR  APTT  CBC  COMPREHENSIVE METABOLIC PANEL  TROPONIN I  GLUCOSE, CAPILLARY  POCT I-STAT TROPONIN I   Ct Head (brain) Wo Contrast  06/23/2013   *RADIOLOGY REPORT*  Clinical Data: Headache.  Acute hypertension.  CT HEAD WITHOUT CONTRAST  Technique:  Contiguous axial images were obtained from the base of the skull through the vertex without contrast.  Comparison: CT head 03/18/2013.  Findings: Ventricular system normal in size and appearance for age. No mass lesion.  No midline shift.  No acute hemorrhage or hematoma.  No extra-axial fluid collections.  No evidence of acute infarction.  No focal brain parenchymal abnormalities.  No significant interval change.  No focal osseous abnormalities involving the skull.  Visualized paranasal sinuses, mastoid air cells, and middle ear cavities well- aerated.  IMPRESSION: Normal and stable examination.   Original Report Authenticated By: Hulan Saas, M.D.   1. Left-sided headache   2. High blood pressure     MDM  Clinically MSK HA with uncontrolled HTN.   Stroke work up done from triage.  Normal neuro exam. Stressed importance of taking medicines and fup for bp.  BP 130/ 100 on last recheck prior to dc. CT reviewed, no acute findings. Starting bp meds but pt realizes he will need adjustment/ fup in next 48 hrs.   DC  Enid Skeens, MD 06/23/13 1755

## 2013-06-23 NOTE — ED Notes (Signed)
Pt is here with one week headache that has been constant then developed left jaw numbness, left arm numbness, and off balance

## 2013-07-15 ENCOUNTER — Ambulatory Visit: Payer: Self-pay | Attending: Family Medicine | Admitting: Internal Medicine

## 2013-07-15 VITALS — BP 143/93 | HR 69 | Temp 97.7°F | Resp 18 | Ht 68.0 in | Wt 183.0 lb

## 2013-07-15 DIAGNOSIS — R079 Chest pain, unspecified: Secondary | ICD-10-CM

## 2013-07-15 DIAGNOSIS — F1210 Cannabis abuse, uncomplicated: Secondary | ICD-10-CM | POA: Insufficient documentation

## 2013-07-15 DIAGNOSIS — I10 Essential (primary) hypertension: Secondary | ICD-10-CM | POA: Insufficient documentation

## 2013-07-15 MED ORDER — ASPIRIN 81 MG PO TABS: 325.0000 mg | ORAL_TABLET | Freq: Every day | ORAL | Status: DC | PRN

## 2013-07-15 MED ORDER — AMLODIPINE BESYLATE 5 MG PO TABS: 5.0000 mg | ORAL_TABLET | Freq: Every day | ORAL | Status: DC

## 2013-07-15 NOTE — Progress Notes (Signed)
Patient Demographics  Carl Greene, is a 37 y.o. male  WJX:[id number]  OZH:[id number]  DOB - May 15, 1976  Chief Complaint  Patient presents with  . Hypertension        Subjective:   [name] today is here to establish primary care.  Currently patient has no complaints.He has not been taking any of his Anti-hypertensives (amlodipine, hctz) for the past month or so. He wants to restart them today. Patient has also has No headache, No chest pain, No abdominal pain,No Nausea, No new weakness tingling or numbness, No Cough or SOB.   Objective:    Filed Vitals:   07/15/13 1437  BP: 143/93  Pulse: 69  Temp: 97.7 F (36.5 C)  TempSrc: Oral  Resp: 18  Height: 5\' 8"  (1.727 m)  Weight: 183 lb (83.008 kg)     ALLERGIES:  No Known Allergies  PAST MEDICAL HISTORY: Past Medical History  Diagnosis Date  . Chest pain     Echo EF 50-55%. [facility]uke study 4/11: walked 9:15 no ecg changes. EF 42%?  inferoir attenuation no scar.   [name] Dyspnea   . Lightheadedness     with CP  . HTN (hypertension)     PAST SURGICAL HISTORY: Past Surgical History  Procedure Laterality Date  . Fracture surgery    . Mandible fracture surgery      FAMILY HISTORY: Family History  Problem Relation Age of Onset  . Coronary artery disease Maternal Grandfather   . Hypertension Mother     MEDICATIONS AT HOME: Prior to Admission medications   Medication Sig Start Date End Date Taking? Authorizing Provider  amLODipine (NORVASC) 5 MG tablet Take 1 tablet (5 mg total) by mouth daily. 07/15/13   Bengie Kaucher Levora Dredge, MD  aspirin 81 MG tablet Take 4 tablets (325 mg total) by mouth daily as needed for pain (for headache.). 07/15/13   Maretta Bees, MD    SOCIAL HISTORY:   reports that he quit smoking about 4 months ago. His smoking use included Cigarettes. He smoked 0.50 packs per day. He has quit using smokeless tobacco. He reports that he uses illicit drugs (Marijuana). He reports that he does not  drink alcohol.  REVIEW OF SYSTEMS:  Constitutional:   No   Fevers, chills, fatigue.  HEENT:    No headaches, Sore throat,   Cardio-vascular: No chest pain,  Orthopnea, swelling in lower extremities, anasarca, palpitations  GI:  No abdominal pain, nausea, vomiting, diarrhea  Resp: No shortness of breath,  No coughing up of blood.No cough.No wheezing.  Skin:  no rash or lesions.  GU:  no dysuria, change in color of urine, no urgency or frequency.  No flank pain.  Musculoskeletal: No joint pain or swelling.  No decreased range of motion.  No back pain.  Psych: No change in mood or affect. No depression or anxiety.  No memory loss.   Exam  General appearance :Awake, alert, not in any distress. Speech Clear. Not toxic Looking HEENT: Atraumatic and Normocephalic, pupils equally reactive to light and accomodation Neck: supple, no JVD. No cervical lymphadenopathy.  Chest:Good air entry bilaterally, no added sounds  CVS: S1 S2 regular, no murmurs.  Abdomen: Bowel sounds present, Non tender and not distended with no gaurding, rigidity or rebound. Extremities: B/L Lower Ext shows no edema, both legs are warm to touch Neurology: Awake alert, and oriented X 3, CN II-XII intact, Non focal Skin:No Rash Wounds:N/A    Data Review   CBC No results found for  this basename: WBC, HGB, HCT, PLT, MCV, MCH, MCHC, RDW, NEUTRABS, LYMPHSABS, MONOABS, EOSABS, BASOSABS, BANDABS, BANDSABD,  in the last 168 hours  Chemistries   No results found for this basename: NA, K, CL, CO2, GLUCOSE, BUN, CREATININE, GFRCGP, CALCIUM, MG, AST, ALT, ALKPHOS, BILITOT,  in the last 168 hours ------------------------------------------------------------------------------------------------------------------ No results found for this basename: HGBA1C,  in the last 72 hours ------------------------------------------------------------------------------------------------------------------ No results found for this  basename: CHOL, HDL, LDLCALC, TRIG, CHOLHDL, LDLDIRECT,  in the last 72 hours ------------------------------------------------------------------------------------------------------------------ No results found for this basename: TSH, T4TOTAL, FREET3, T3FREE, THYROIDAB,  in the last 72 hours ------------------------------------------------------------------------------------------------------------------ No results found for this basename: VITAMINB12, FOLATE, FERRITIN, TIBC, IRON, RETICCTPCT,  in the last 72 hours  Coagulation profile  No results found for this basename: INR, PROTIME,  in the last 168 hours    Assessment & Plan   HTN -not taking any medications for the past 1 month or so, will restart Amlodipine 5 mg, will not start on HCTZ at present-given BP not that elevated today -follow up in 1 month for further optimization  Hx of Atypical chest pain -c/w ASA 81 mg  Hx of marijuana abuse -counseled  Follow up in 1 month  The patient was given clear instructions to go to ER or return to medical center if symptoms don't improve, worsen or new problems develop. The patient verbalized understanding. The patient was told to call to get lab results if they haven't heard anything in the next week.

## 2013-08-07 ENCOUNTER — Emergency Department (HOSPITAL_COMMUNITY)
Admission: EM | Admit: 2013-08-07 | Discharge: 2013-08-07 | Disposition: A | Discharge status: Home / Self Care | Payer: Self-pay | Attending: Emergency Medicine | Admitting: Emergency Medicine

## 2013-08-07 ENCOUNTER — Emergency Department (HOSPITAL_COMMUNITY): Payer: Self-pay

## 2013-08-07 ENCOUNTER — Encounter (HOSPITAL_COMMUNITY): Payer: Self-pay | Admitting: *Deleted

## 2013-08-07 DIAGNOSIS — Y929 Unspecified place or not applicable: Secondary | ICD-10-CM | POA: Insufficient documentation

## 2013-08-07 DIAGNOSIS — S62309A Unspecified fracture of unspecified metacarpal bone, initial encounter for closed fracture: Secondary | ICD-10-CM | POA: Insufficient documentation

## 2013-08-07 DIAGNOSIS — Y9389 Activity, other specified: Secondary | ICD-10-CM | POA: Insufficient documentation

## 2013-08-07 DIAGNOSIS — Z87891 Personal history of nicotine dependence: Secondary | ICD-10-CM | POA: Insufficient documentation

## 2013-08-07 DIAGNOSIS — W2203XA Walked into furniture, initial encounter: Secondary | ICD-10-CM | POA: Insufficient documentation

## 2013-08-07 DIAGNOSIS — Z9889 Other specified postprocedural states: Secondary | ICD-10-CM | POA: Insufficient documentation

## 2013-08-07 DIAGNOSIS — I10 Essential (primary) hypertension: Secondary | ICD-10-CM | POA: Insufficient documentation

## 2013-08-07 DIAGNOSIS — S62306A Unspecified fracture of fifth metacarpal bone, right hand, initial encounter for closed fracture: Secondary | ICD-10-CM

## 2013-08-07 MED ORDER — HYDROCODONE-ACETAMINOPHEN 5-325 MG PO TABS: 1.0000 | ORAL_TABLET | ORAL | Status: DC | PRN

## 2013-08-07 MED ORDER — HYDROCODONE-ACETAMINOPHEN 5-325 MG PO TABS
1.0000 | ORAL_TABLET | Freq: Once | ORAL | Status: AC
  Administered 2013-08-07: 1 via ORAL
  Filled 2013-08-07: qty 1

## 2013-08-07 NOTE — ED Notes (Signed)
The pt has rt hand pain. He fell onto his hand last pm and he has had previous fracture and surgery on that hand  2 years ago

## 2013-08-07 NOTE — ED Provider Notes (Signed)
CSN: [id number]     Arrival date & time 08/07/13  1558 History  This chart was scribed for non-physician practitioner, Fayrene Helper, PA-C working with Gwyneth Sprout, MD by Ronal Fear, ED scribe. This patient was seen in room TR05C/TR05C and the patient's care was started at 5:46 PM.   Chief Complaint  Patient presents with  . Hand Pain    Patient is a 37 y.o. male presenting with hand pain and hand injury. The history is provided by the patient. No language interpreter was used.  Hand Pain This is a new problem. The current episode started 6 to 12 hours ago. The problem has not changed since onset. Hand Injury Location:  Hand Time since incident:  1 day Injury: no   Hand location:  R hand Pain details:    Quality:  Throbbing   Radiates to:  Does not radiate   Severity:  Mild   Onset quality:  Sudden   Duration:  1 day   Timing:  Rare   Progression:  Unchanged Chronicity:  New Handedness:  Right-handed Dislocation: no   Prior injury to area:  Yes Relieved by:  Being still and ice Worsened by:  Movement Ineffective treatments:  None tried Associated symptoms: no fever and no numbness     Pt was trying to catch something and hit his right hand on there table last night. Pt states that the pain is throbbing and currently at a 4/10. The pain is localized in the affected area and does not radiate to the elbow Pt had a a break in the right hand in 2012 with surgery. Pt is R hand dominant  Past Medical History  Diagnosis Date  . Chest pain     Echo EF 50-55%. Nuke study 4/11: walked 9:15 no ecg changes. EF 42%?  inferoir attenuation no scar.   [name] Dyspnea   . Lightheadedness     with CP  . HTN (hypertension)    Past Surgical History  Procedure Laterality Date  . Fracture surgery    . Mandible fracture surgery     Family History  Problem Relation Age of Onset  . Coronary artery disease Maternal Grandfather   . Hypertension Mother    History  Substance Use Topics  .  Smoking status: Former Smoker -- 0.50 packs/day    Types: Cigarettes    Quit date: 03/12/2013  . Smokeless tobacco: Former Neurosurgeon     Comment: quit 4/11.  Young [employer]. pt quit again 03/11/2013  . Alcohol Use: No    Review of Systems  Constitutional: Negative for fever.  Musculoskeletal: Positive for myalgias and arthralgias. Negative for joint swelling.  Skin: Negative for rash and wound.  Neurological: Negative for numbness.    Allergies  Review of patient's allergies indicates no known allergies.  Home Medications   Current Outpatient Rx  Name  Route  Sig  Dispense  Refill  . aspirin 81 MG tablet   Oral   Take 4 tablets (325 mg total) by mouth daily as needed for pain (for headache.).          BP 149/97  Pulse 73  Temp(Src) 99.3 F (37.4 C) (Oral)  Resp 16  SpO2 98% Physical Exam  Nursing note and vitals reviewed. Constitutional: He is oriented to person, place, and time. He appears well-developed and well-nourished. No distress.  HENT:  Head: Normocephalic and atraumatic.  Eyes: EOM are normal.  Neck: Neck supple. No tracheal deviation present.  Cardiovascular: Normal rate.   Pulmonary/Chest:  Effort normal. No respiratory distress.  Abdominal: Soft. There is no tenderness.  Musculoskeletal: He exhibits edema and tenderness.  Right hand tenderness noted to fifth MCP and also along fifth mcp joint and bones and 4th finger with some crepitus but no obvious deformity, decreased ROM in 4th and 5th finger with capillary refill < 3s. moderate edema to ulnar aspect of right hand. ecchymosis to hypothena of right hand   Neurological: He is alert and oriented to person, place, and time.  Skin: Skin is warm and dry.  Psychiatric: He has a normal mood and affect. His behavior is normal.    ED Course  Procedures (including critical care time)  DIAGNOSTIC STUDIES: Oxygen Saturation is 98% on RA, normal by my interpretation.    COORDINATION OF CARE: 5:56 PM- Pt advised of  plan for treatment including pain medications, splint, and referral to a hand specialist, pt expresses understanding and agrees.      Labs Review Labs Reviewed - No data to display Imaging Review Dg Hand Complete Right  08/07/2013   CLINICAL DATA:  Pain post injury right hand  EXAM: RIGHT HAND - COMPLETE 3+ VIEW  COMPARISON:  08/12/2010  FINDINGS: Four views of the right hand submitted. There is mild displaced fracture chest at there is minimal displaced fracture distal aspect of right 5th metacarpal.  IMPRESSION: Minimal displaced fracture distal aspect of the right 5th metacarpal.   Electronically Signed   By: Natasha Mead   On: 08/07/2013 16:47    MDM   1. Closed fracture of fifth metacarpal bone of right hand, initial encounter    BP 148/102  Pulse 68  Temp(Src) 98.2 F (36.8 C) (Oral)  Resp 16  SpO2 100%  I have reviewed nursing notes and vital signs. I personally reviewed the imaging tests through PACS system  I reviewed available ER/hospitalization records thought the EMR   I personally performed the services described in this documentation, which was scribed in my presence. The recorded information has been reviewed and is accurate.     Fayrene Helper, PA-C 08/07/13 2356

## 2013-08-07 NOTE — Progress Notes (Signed)
Orthopedic Tech Progress Note Patient Details:  Carl Greene February 22, 1976 [id number] Applied fiberglass ulnar gutter splint to RUE.  Pulses, capillary refill, motion, sensation intact before and after splinting.  Capillary refill less than 2 seconds.  Applied arm sling to RUE. Ortho Devices Type of Ortho Device: Arm sling;Ulna gutter splint Ortho Device/Splint Location: RUE Ortho Device/Splint Interventions: Application   Lesle Chris 08/07/2013, 6:39 PM

## 2013-08-08 NOTE — ED Provider Notes (Signed)
Medical screening examination/treatment/procedure(s) were performed by non-physician practitioner and as supervising physician I was immediately available for consultation/collaboration.   Gwyneth Sprout, MD 08/08/13 [phone]

## 2013-08-13 ENCOUNTER — Encounter (HOSPITAL_COMMUNITY): Payer: Self-pay | Admitting: Emergency Medicine

## 2013-08-13 ENCOUNTER — Emergency Department (HOSPITAL_COMMUNITY)
Admission: EM | Admit: 2013-08-13 | Discharge: 2013-08-13 | Disposition: A | Discharge status: Home / Self Care | Payer: Self-pay | Attending: Emergency Medicine | Admitting: Emergency Medicine

## 2013-08-13 DIAGNOSIS — Z79899 Other long term (current) drug therapy: Secondary | ICD-10-CM | POA: Insufficient documentation

## 2013-08-13 DIAGNOSIS — Z87891 Personal history of nicotine dependence: Secondary | ICD-10-CM | POA: Insufficient documentation

## 2013-08-13 DIAGNOSIS — S62306S Unspecified fracture of fifth metacarpal bone, right hand, sequela: Secondary | ICD-10-CM

## 2013-08-13 DIAGNOSIS — G8911 Acute pain due to trauma: Secondary | ICD-10-CM | POA: Insufficient documentation

## 2013-08-13 DIAGNOSIS — M79641 Pain in right hand: Secondary | ICD-10-CM

## 2013-08-13 DIAGNOSIS — I10 Essential (primary) hypertension: Secondary | ICD-10-CM | POA: Insufficient documentation

## 2013-08-13 DIAGNOSIS — M25549 Pain in joints of unspecified hand: Secondary | ICD-10-CM | POA: Insufficient documentation

## 2013-08-13 NOTE — Progress Notes (Signed)
Orthopedic Tech Progress Note Patient Details:  Carl Greene Oct 15, 1976 [id number]  Ortho Devices Type of Ortho Device: Ace wrap;Ulna gutter splint Ortho Device/Splint Location: rue Ortho Device/Splint Interventions: Application   Carl Greene 08/13/2013, 11:07 PM

## 2013-08-13 NOTE — ED Notes (Signed)
RN noticed BP from triage read 85/0. RN obtained corrected BP of 128/90.

## 2013-08-13 NOTE — ED Provider Notes (Signed)
CSN: [id number]     Arrival date & time 08/13/13  2031 History  This chart was scribed for non-physician practitioner Dierdre Forth, PA-C working with Candyce Churn, MD by Danella Maiers, ED Scribe. This patient was seen in room TR04C/TR04C and the patient's care was started at 9:30 PM.    Chief Complaint  Patient presents with  . Hand Injury   The history is provided by the patient. No language interpreter was used.   HPI Comments: Carl Greene is a 37 y.o. male who presents to the Emergency Department complaining of right hand injury. Pt was seen here for the same injury, right closed 5th metacarpal fracture, on 08/07/13 and referred to orthopedist but he did not go. He said he called the orthopedist and the orthopedist would not see him. He is requesting another splint and ace wrap application because it got wet. He denies pain, any new injuries.  Past Medical History  Diagnosis Date  . Chest pain     Echo EF 50-55%. Nuke study 4/11: walked 9:15 no ecg changes. EF 42%?  inferoir attenuation no scar.   [name] Dyspnea   . Lightheadedness     with CP  . HTN (hypertension)    Past Surgical History  Procedure Laterality Date  . Fracture surgery    . Mandible fracture surgery     Family History  Problem Relation Age of Onset  . Coronary artery disease Maternal Grandfather   . Hypertension Mother    History  Substance Use Topics  . Smoking status: Former Smoker -- 0.50 packs/day    Types: Cigarettes    Quit date: 03/12/2013  . Smokeless tobacco: Former Neurosurgeon     Comment: quit 4/11.  Young [employer]. pt quit again 03/11/2013  . Alcohol Use: No    Review of Systems  Constitutional: Negative for fever, diaphoresis, appetite change, fatigue and unexpected weight change.  HENT: Negative for mouth sores and neck stiffness.   Eyes: Negative for visual disturbance.  Respiratory: Negative for cough, chest tightness, shortness of breath and wheezing.   Cardiovascular: Negative for  chest pain.  Gastrointestinal: Negative for nausea, vomiting, abdominal pain, diarrhea and constipation.  Endocrine: Negative for polydipsia, polyphagia and polyuria.  Genitourinary: Negative for dysuria, urgency, frequency and hematuria.  Musculoskeletal: Negative for back pain.  Skin: Negative for rash.  Allergic/Immunologic: Negative for immunocompromised state.  Neurological: Negative for syncope, light-headedness and headaches.  Hematological: Does not bruise/bleed easily.  Psychiatric/Behavioral: Negative for sleep disturbance. The patient is not nervous/anxious.     Allergies  Review of patient's allergies indicates no known allergies.  Home Medications   Current Outpatient Rx  Name  Route  Sig  Dispense  Refill  . amLODipine (NORVASC) 5 MG tablet   Oral   Take 5 mg by mouth daily.         [name] aspirin 81 MG tablet   Oral   Take 4 tablets (325 mg total) by mouth daily as needed for pain (for headache.).          BP 85/0  Pulse 85  Temp(Src) 99.4 F (37.4 C) (Oral)  Resp 14  SpO2 99% Physical Exam  Nursing note and vitals reviewed. Constitutional: He appears well-developed and well-nourished. No distress.  Awake, alert, nontoxic appearance  HENT:  Head: Normocephalic and atraumatic.  Mouth/Throat: Oropharynx is clear and moist. No oropharyngeal exudate.  Eyes: Conjunctivae are normal. No scleral icterus.  Neck: Normal range of motion. Neck supple.  Cardiovascular: Normal rate, regular  rhythm, S1 normal, S2 normal, normal heart sounds and intact distal pulses.   No murmur heard. Pulses:      Radial pulses are 2+ on the right side, and 2+ on the left side.  Capillary refill less than 3  Pulmonary/Chest: Effort normal and breath sounds normal. No respiratory distress. He has no wheezes.  Abdominal: Soft. Bowel sounds are normal. He exhibits no mass. There is no tenderness. There is no rebound and no guarding.  Musculoskeletal: Normal range of motion. He exhibits  no edema.  Full range of motion of the right fingers and right wrist Pain to palpation of the right fifth metacarpal  Neurological: He is alert.  Speech is clear and goal oriented Moves extremities without ataxia Sensation intact Strength 5 out of 5 in the RUE including strong grip strength   Skin: Skin is warm and dry. He is not diaphoretic.  Psychiatric: He has a normal mood and affect.    ED Course  Procedures (including critical care time) Medications - No data to display  DIAGNOSTIC STUDIES: Oxygen Saturation is 99% on RA, normal by my interpretation.    COORDINATION OF CARE: 10:32 PM- Discussed treatment plan with pt which includes referral to another orthopedist and a splint application and pt agrees to plan.    Labs Review Labs Reviewed - No data to display Imaging Review No results found.  MDM   1. Hand pain, right   2. Fracture of fifth metacarpal bone of right hand, sequela    Carl Greene presents for splint reapplication after a became wet.  Pt again advised to follow up with orthopedics for further management. Pt given Dr. Debby Bud referral. Patient given new plint while in ED, conservative therapy recommended and discussed. Patient will be dc home & is agreeable with above plan.  It has been determined that no acute conditions requiring further emergency intervention are present at this time. The patient/guardian have been advised of the diagnosis and plan. We have discussed signs and symptoms that warrant return to the ED, such as changes or worsening in symptoms.   Vital signs are stable at discharge.   BP 128/90  Pulse 85  Temp(Src) 99.4 F (37.4 C) (Oral)  Resp 14  SpO2 99%  Patient/guardian has voiced understanding and agreed to follow-up with the PCP or specialist.    I personally performed the services described in this documentation, which was scribed in my presence. The recorded information has been reviewed and is accurate.   Dahlia Client  Yatziry Deakins, PA-C 08/13/13 2308
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
## 2013-08-14 NOTE — ED Provider Notes (Signed)
Medical screening examination/treatment/procedure(s) were performed by non-physician practitioner and as supervising physician I was immediately available for consultation/collaboration.   Candyce Churn, MD 08/14/13 [phone]

## 2013-08-18 ENCOUNTER — Ambulatory Visit: Payer: Self-pay | Admitting: Internal Medicine

## 2013-10-16 ENCOUNTER — Encounter (HOSPITAL_COMMUNITY): Payer: Self-pay | Admitting: Emergency Medicine

## 2013-10-16 ENCOUNTER — Emergency Department (HOSPITAL_COMMUNITY)
Admission: EM | Admit: 2013-10-16 | Discharge: 2013-10-16 | Disposition: A | Discharge status: Home / Self Care | Payer: No Typology Code available for payment source | Attending: Emergency Medicine | Admitting: Emergency Medicine

## 2013-10-16 DIAGNOSIS — Y92410 Unspecified street and highway as the place of occurrence of the external cause: Secondary | ICD-10-CM | POA: Insufficient documentation

## 2013-10-16 DIAGNOSIS — S39012A Strain of muscle, fascia and tendon of lower back, initial encounter: Secondary | ICD-10-CM

## 2013-10-16 DIAGNOSIS — Z87891 Personal history of nicotine dependence: Secondary | ICD-10-CM | POA: Insufficient documentation

## 2013-10-16 DIAGNOSIS — I10 Essential (primary) hypertension: Secondary | ICD-10-CM | POA: Insufficient documentation

## 2013-10-16 DIAGNOSIS — Z8709 Personal history of other diseases of the respiratory system: Secondary | ICD-10-CM | POA: Insufficient documentation

## 2013-10-16 DIAGNOSIS — M549 Dorsalgia, unspecified: Secondary | ICD-10-CM | POA: Insufficient documentation

## 2013-10-16 DIAGNOSIS — S335XXA Sprain of ligaments of lumbar spine, initial encounter: Secondary | ICD-10-CM | POA: Insufficient documentation

## 2013-10-16 DIAGNOSIS — Z79899 Other long term (current) drug therapy: Secondary | ICD-10-CM | POA: Insufficient documentation

## 2013-10-16 DIAGNOSIS — Y9389 Activity, other specified: Secondary | ICD-10-CM | POA: Insufficient documentation
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
## 2014-09-17 ENCOUNTER — Telehealth: Payer: Self-pay | Admitting: *Deleted

## 2014-09-17 NOTE — Telephone Encounter (Signed)
I received a request from [facility] for a prescription for amlodipine for this patient.  Pt is overdue for an appt with Dr Aundra Dubin. LMTCB for pt to schedule appt, it will be OK to schedule with PA/NP if they have sooner availability,  and then will be able to refill his medication when he has scheduled his appointment.

## 2014-09-20 NOTE — Telephone Encounter (Signed)
LMTCB with sister,Laticia.

## 2014-09-21 NOTE — Telephone Encounter (Signed)
Pt states he is unable to schedule an appt at this time because of his work schedule. I told patient I did not want him to go without his medication, and would try to work something out for him.  Pt states Dr Haroldine Laws told him in the past that he would continue to refill his medication and that he will call his office for a refill for amlodipine.

## 2014-09-21 NOTE — Telephone Encounter (Signed)
I spoke with patient about scheduling an appt so we can refill amlodipine.

## 2015-01-18 ENCOUNTER — Encounter (HOSPITAL_COMMUNITY): Payer: Self-pay | Admitting: Emergency Medicine

## 2015-01-18 ENCOUNTER — Emergency Department (HOSPITAL_COMMUNITY)
Admission: EM | Admit: 2015-01-18 | Discharge: 2015-01-18 | Disposition: A | Discharge status: Home / Self Care | Payer: No Typology Code available for payment source | Attending: Emergency Medicine | Admitting: Emergency Medicine

## 2015-01-18 DIAGNOSIS — Z87891 Personal history of nicotine dependence: Secondary | ICD-10-CM | POA: Diagnosis not present

## 2015-01-18 DIAGNOSIS — S3992XA Unspecified injury of lower back, initial encounter: Secondary | ICD-10-CM | POA: Insufficient documentation

## 2015-01-18 DIAGNOSIS — S0990XA Unspecified injury of head, initial encounter: Secondary | ICD-10-CM | POA: Diagnosis not present

## 2015-01-18 DIAGNOSIS — I10 Essential (primary) hypertension: Secondary | ICD-10-CM | POA: Insufficient documentation

## 2015-01-18 DIAGNOSIS — Y998 Other external cause status: Secondary | ICD-10-CM | POA: Diagnosis not present

## 2015-01-18 DIAGNOSIS — S199XXA Unspecified injury of neck, initial encounter: Secondary | ICD-10-CM | POA: Insufficient documentation

## 2015-01-18 DIAGNOSIS — Y92410 Unspecified street and highway as the place of occurrence of the external cause: Secondary | ICD-10-CM | POA: Diagnosis not present

## 2015-01-18 DIAGNOSIS — M545 Low back pain, unspecified: Secondary | ICD-10-CM

## 2015-01-18 DIAGNOSIS — S299XXA Unspecified injury of thorax, initial encounter: Secondary | ICD-10-CM | POA: Diagnosis not present

## 2015-01-18 DIAGNOSIS — Y9389 Activity, other specified: Secondary | ICD-10-CM | POA: Insufficient documentation

## 2015-01-18 DIAGNOSIS — M542 Cervicalgia: Secondary | ICD-10-CM

## 2015-01-18 MED ORDER — IBUPROFEN 800 MG PO TABS: 800.0000 mg | ORAL_TABLET | Freq: Three times a day (TID) | ORAL | Status: DC

## 2015-01-18 MED ORDER — HYDROCODONE-ACETAMINOPHEN 5-325 MG PO TABS: 2.0000 | ORAL_TABLET | ORAL | Status: DC | PRN

## 2015-01-18 NOTE — Discharge Instructions (Signed)
Cervical Sprain °A cervical sprain is an injury in the neck in which the strong, fibrous tissues (ligaments) that connect your neck bones stretch or tear. Cervical sprains can range from mild to severe. Severe cervical sprains can cause the neck vertebrae to be unstable. This can lead to damage of the spinal cord and can result in serious nervous system problems. The amount of time it takes for a cervical sprain to get better depends on the cause and extent of the injury. Most cervical sprains heal in 1 to 3 weeks. °CAUSES  °Severe cervical sprains may be caused by:  °· Contact sport injuries (such as from football, rugby, wrestling, hockey, auto racing, gymnastics, diving, martial arts, or boxing).   °· Motor vehicle collisions.   °· Whiplash injuries. This is an injury from a sudden forward and backward whipping movement of the head and neck.  °· Falls.   °Mild cervical sprains may be caused by:  °· Being in an awkward position, such as while cradling a telephone between your ear and shoulder.   °· Sitting in a chair that does not offer proper support.   °· Working at a poorly designed computer station.   °· Looking up or down for long periods of time.   °SYMPTOMS  °· Pain, soreness, stiffness, or a burning sensation in the front, back, or sides of the neck. This discomfort may develop immediately after the injury or slowly, 24 hours or more after the injury.   °· Pain or tenderness directly in the middle of the back of the neck.   °· Shoulder or upper back pain.   °· Limited ability to move the neck.   °· Headache.   °· Dizziness.   °· Weakness, numbness, or tingling in the hands or arms.   °· Muscle spasms.   °· Difficulty swallowing or chewing.   °· Tenderness and swelling of the neck.   °DIAGNOSIS  °Most of the time your health care provider can diagnose a cervical sprain by taking your history and doing a physical exam. Your health care provider will ask about previous neck injuries and any known neck  problems, such as arthritis in the neck. X-rays may be taken to find out if there are any other problems, such as with the bones of the neck. Other tests, such as a CT scan or MRI, may also be needed.  °TREATMENT  °Treatment depends on the severity of the cervical sprain. Mild sprains can be treated with rest, keeping the neck in place (immobilization), and pain medicines. Severe cervical sprains are immediately immobilized. Further treatment is done to help with pain, muscle spasms, and other symptoms and may include: °· Medicines, such as pain relievers, numbing medicines, or muscle relaxants.   °· Physical therapy. This may involve stretching exercises, strengthening exercises, and posture training. Exercises and improved posture can help stabilize the neck, strengthen muscles, and help stop symptoms from returning.   °HOME CARE INSTRUCTIONS  °· Put ice on the injured area.   °¨ Put ice in a plastic bag.   °¨ Place a towel between your skin and the bag.   °¨ Leave the ice on for 15-20 minutes, 3-4 times a day.   °· If your injury was severe, you may have been given a cervical collar to wear. A cervical collar is a two-piece collar designed to keep your neck from moving while it heals. °¨ Do not remove the collar unless instructed by your health care provider. °¨ If you have long hair, keep it outside of the collar. °¨ Ask your health care provider before making any adjustments to your collar. Minor   adjustments may be required over time to improve comfort and reduce pressure on your chin or on the back of your head. °¨ If you are allowed to remove the collar for cleaning or bathing, follow your health care provider's instructions on how to do so safely. °¨ Keep your collar clean by wiping it with mild soap and water and drying it completely. If the collar you have been given includes removable pads, remove them every 1-2 days and hand wash them with soap and water. Allow them to air dry. They should be completely  dry before you wear them in the collar. °¨ If you are allowed to remove the collar for cleaning and bathing, wash and dry the skin of your neck. Check your skin for irritation or sores. If you see any, tell your health care provider. °¨ Do not drive while wearing the collar.   °· Only take over-the-counter or prescription medicines for pain, discomfort, or fever as directed by your health care provider.   °· Keep all follow-up appointments as directed by your health care provider.   °· Keep all physical therapy appointments as directed by your health care provider.   °· Make any needed adjustments to your workstation to promote good posture.   °· Avoid positions and activities that make your symptoms worse.   °· Warm up and stretch before being active to help prevent problems.   °SEEK MEDICAL CARE IF:  °· Your pain is not controlled with medicine.   °· You are unable to decrease your pain medicine over time as planned.   °· Your activity level is not improving as expected.   °SEEK IMMEDIATE MEDICAL CARE IF:  °· You develop any bleeding. °· You develop stomach upset. °· You have signs of an allergic reaction to your medicine.   °· Your symptoms get worse.   °· You develop new, unexplained symptoms.   °· You have numbness, tingling, weakness, or paralysis in any part of your body.   °MAKE SURE YOU:  °· Understand these instructions. °· Will watch your condition. °· Will get help right away if you are not doing well or get worse. °Document Released: 08/26/2007 Document Revised: 11/03/2013 Document Reviewed: 05/06/2013 °[employer]® Patient Information ©2015 [employer]. This information is not intended to replace advice given to you by your health care provider. Make sure you discuss any questions you have with your health care provider. ° °Motor Vehicle Collision °It is common to have multiple bruises and sore muscles after a motor vehicle collision (MVC). These tend to feel worse for the first 24 hours. You may have  the most stiffness and soreness over the first several hours. You may also feel worse when you wake up the first morning after your collision. After this point, you will usually begin to improve with each day. The speed of improvement often depends on the severity of the collision, the number of injuries, and the location and nature of these injuries. °HOME CARE INSTRUCTIONS °· Put ice on the injured area. °¨ Put ice in a plastic bag. °¨ Place a towel between your skin and the bag. °¨ Leave the ice on for 15-20 minutes, 3-4 times a day, or as directed by your health care provider. °· Drink enough fluids to keep your urine clear or pale yellow. Do not drink alcohol. °· Take a warm shower or bath once or twice a day. This will increase blood flow to sore muscles. °· You may return to activities as directed by your caregiver. Be careful when lifting, as this may aggravate neck or back   pain. °· Only take over-the-counter or prescription medicines for pain, discomfort, or fever as directed by your caregiver. Do not use aspirin. This may increase bruising and bleeding. °SEEK IMMEDIATE MEDICAL CARE IF: °· You have numbness, tingling, or weakness in the arms or legs. °· You develop severe headaches not relieved with medicine. °· You have severe neck pain, especially tenderness in the middle of the back of your neck. °· You have changes in bowel or bladder control. °· There is increasing pain in any area of the body. °· You have shortness of breath, light-headedness, dizziness, or fainting. °· You have chest pain. °· You feel sick to your stomach (nauseous), throw up (vomit), or sweat. °· You have increasing abdominal discomfort. °· There is blood in your urine, stool, or vomit. °· You have pain in your shoulder (shoulder strap areas). °· You feel your symptoms are getting worse. °MAKE SURE YOU: °· Understand these instructions. °· Will watch your condition. °· Will get help right away if you are not doing well or get  worse. °Document Released: 10/29/2005 Document Revised: 03/15/2014 Document Reviewed: 03/28/2011 °Exit[employer]® Patient Information ©2015 [employer]. This information is not intended to replace advice given to you by your health care provider. Make sure you discuss any questions you have with your health care provider. ° °
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
## 2015-01-19 ENCOUNTER — Encounter (HOSPITAL_BASED_OUTPATIENT_CLINIC_OR_DEPARTMENT_OTHER): Payer: Self-pay | Admitting: *Deleted

## 2015-01-19 ENCOUNTER — Emergency Department (HOSPITAL_BASED_OUTPATIENT_CLINIC_OR_DEPARTMENT_OTHER): Payer: No Typology Code available for payment source

## 2015-01-19 ENCOUNTER — Emergency Department (HOSPITAL_BASED_OUTPATIENT_CLINIC_OR_DEPARTMENT_OTHER)
Admission: EM | Admit: 2015-01-19 | Discharge: 2015-01-19 | Disposition: A | Discharge status: Home / Self Care | Payer: No Typology Code available for payment source | Attending: Emergency Medicine | Admitting: Emergency Medicine

## 2015-01-19 DIAGNOSIS — Z87891 Personal history of nicotine dependence: Secondary | ICD-10-CM | POA: Diagnosis not present

## 2015-01-19 DIAGNOSIS — S8992XD Unspecified injury of left lower leg, subsequent encounter: Secondary | ICD-10-CM | POA: Diagnosis present

## 2015-01-19 DIAGNOSIS — S8002XD Contusion of left knee, subsequent encounter: Secondary | ICD-10-CM | POA: Insufficient documentation

## 2015-01-19 DIAGNOSIS — S8002XA Contusion of left knee, initial encounter: Secondary | ICD-10-CM

## 2015-01-19 DIAGNOSIS — I10 Essential (primary) hypertension: Secondary | ICD-10-CM | POA: Insufficient documentation
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
## 2015-01-19 NOTE — Discharge Instructions (Signed)
Continue your medications as prescribed yesterday.  Follow up with your primary Dr. if not improving in the next week.   Contusion A contusion is a deep bruise. Contusions are the result of an injury that caused bleeding under the skin. The contusion may turn blue, purple, or yellow. Minor injuries will give you a painless contusion, but more severe contusions may stay painful and swollen for a few weeks.  CAUSES  A contusion is usually caused by a blow, trauma, or direct force to an area of the body. SYMPTOMS   Swelling and redness of the injured area.  Bruising of the injured area.  Tenderness and soreness of the injured area.  Pain. DIAGNOSIS  The diagnosis can be made by taking a history and physical exam. An X-ray, CT scan, or MRI may be needed to determine if there were any associated injuries, such as fractures. TREATMENT  Specific treatment will depend on what area of the body was injured. In general, the best treatment for a contusion is resting, icing, elevating, and applying cold compresses to the injured area. Over-the-counter medicines may also be recommended for pain control. Ask your caregiver what the best treatment is for your contusion. HOME CARE INSTRUCTIONS   Put ice on the injured area.  Put ice in a plastic bag.  Place a towel between your skin and the bag.  Leave the ice on for 15-20 minutes, 3-4 times a day, or as directed by your health care provider.  Only take over-the-counter or prescription medicines for pain, discomfort, or fever as directed by your caregiver. Your caregiver may recommend avoiding anti-inflammatory medicines (aspirin, ibuprofen, and naproxen) for 48 hours because these medicines may increase bruising.  Rest the injured area.  If possible, elevate the injured area to reduce swelling. SEEK IMMEDIATE MEDICAL CARE IF:   You have increased bruising or swelling.  You have pain that is getting worse.  Your swelling or pain is not  relieved with medicines. MAKE SURE YOU:   Understand these instructions.  Will watch your condition.  Will get help right away if you are not doing well or get worse. Document Released: 08/08/2005 Document Revised: 11/03/2013 Document Reviewed: 09/03/2011 [facility] Patient Information 2015 [place], [place]. This information is not intended to replace advice given to you by your health care provider. Make sure you discuss any questions you have with your health care provider.
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
## 2015-01-26 ENCOUNTER — Emergency Department (HOSPITAL_COMMUNITY)
Admission: EM | Admit: 2015-01-26 | Discharge: 2015-01-26 | Disposition: A | Discharge status: Home / Self Care | Payer: No Typology Code available for payment source | Attending: Emergency Medicine | Admitting: Emergency Medicine

## 2015-01-26 ENCOUNTER — Encounter (HOSPITAL_COMMUNITY): Payer: Self-pay | Admitting: Family Medicine

## 2015-01-26 ENCOUNTER — Emergency Department (HOSPITAL_COMMUNITY): Payer: No Typology Code available for payment source

## 2015-01-26 DIAGNOSIS — Z791 Long term (current) use of non-steroidal anti-inflammatories (NSAID): Secondary | ICD-10-CM | POA: Insufficient documentation

## 2015-01-26 DIAGNOSIS — Z87891 Personal history of nicotine dependence: Secondary | ICD-10-CM | POA: Diagnosis not present

## 2015-01-26 DIAGNOSIS — M542 Cervicalgia: Secondary | ICD-10-CM | POA: Insufficient documentation

## 2015-01-26 DIAGNOSIS — I10 Essential (primary) hypertension: Secondary | ICD-10-CM | POA: Diagnosis not present

## 2015-01-26 DIAGNOSIS — M545 Low back pain: Secondary | ICD-10-CM | POA: Diagnosis present

## 2015-01-26 DIAGNOSIS — M549 Dorsalgia, unspecified: Secondary | ICD-10-CM

## 2015-01-26 MED ORDER — METHOCARBAMOL 750 MG PO TABS: 750.0000 mg | ORAL_TABLET | Freq: Four times a day (QID) | ORAL | Status: DC | PRN

## 2015-01-26 MED ORDER — IBUPROFEN 800 MG PO TABS: 800.0000 mg | ORAL_TABLET | Freq: Three times a day (TID) | ORAL | Status: DC | PRN
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
## 2015-01-26 NOTE — Discharge Instructions (Signed)
Read the information below.  Use the prescribed medication as directed.  Please discuss all new medications with your pharmacist.  You may return to the Emergency Department at any time for worsening condition or any new symptoms that concern you.     If you develop fevers, loss of control of bowel or bladder, weakness or numbness in your legs, or are unable to walk, return to the ER for a recheck.    Back Pain, Adult Low back pain is very common. About 1 in 5 people have back pain.The cause of low back pain is rarely dangerous. The pain often gets better over time.About half of people with a sudden onset of back pain feel better in just 2 weeks. About 8 in 10 people feel better by 6 weeks.  CAUSES Some common causes of back pain include:  Strain of the muscles or ligaments supporting the spine.  Wear and tear (degeneration) of the spinal discs.  Arthritis.  Direct injury to the back. DIAGNOSIS Most of the time, the direct cause of low back pain is not known.However, back pain can be treated effectively even when the exact cause of the pain is unknown.Answering your caregiver's questions about your overall health and symptoms is one of the most accurate ways to make sure the cause of your pain is not dangerous. If your caregiver needs more information, he or she may order lab work or imaging tests (X-rays or MRIs).However, even if imaging tests show changes in your back, this usually does not require surgery. HOME CARE INSTRUCTIONS For many people, back pain returns.Since low back pain is rarely dangerous, it is often a condition that people can learn to [facility] their own.   Remain active. It is stressful on the back to sit or stand in one place. Do not sit, drive, or stand in one place for more than 30 minutes at a time. Take short walks on level surfaces as soon as pain allows.Try to increase the length of time you walk each day.  Do not stay in bed.Resting more than 1 or 2 days can  delay your recovery.  Do not avoid exercise or work.Your body is made to move.It is not dangerous to be active, even though your back may hurt.Your back will likely heal faster if you return to being active before your pain is gone.  Pay attention to your body when you bend and lift. Many people have less discomfortwhen lifting if they bend their knees, keep the load close to their bodies,and avoid twisting. Often, the most comfortable positions are those that put less stress on your recovering back.  Find a comfortable position to sleep. Use a firm mattress and lie on your side with your knees slightly bent. If you lie on your back, put a pillow under your knees.  Only take over-the-counter or prescription medicines as directed by your caregiver. Over-the-counter medicines to reduce pain and inflammation are often the most helpful.Your caregiver may prescribe muscle relaxant drugs.These medicines help dull your pain so you can more quickly return to your normal activities and healthy exercise.  Put ice on the injured area.  Put ice in a plastic bag.  Place a towel between your skin and the bag.  Leave the ice on for 15-20 minutes, 03-04 times a day for the first 2 to 3 days. After that, ice and heat may be alternated to reduce pain and spasms.  Ask your caregiver about trying back exercises and gentle massage. This may be  of some benefit.  Avoid feeling anxious or stressed.Stress increases muscle tension and can worsen back pain.It is important to recognize when you are anxious or stressed and learn ways to manage it.Exercise is a great option. SEEK MEDICAL CARE IF:  You have pain that is not relieved with rest or medicine.  You have pain that does not improve in 1 week.  You have new symptoms.  You are generally not feeling well. SEEK IMMEDIATE MEDICAL CARE IF:   You have pain that radiates from your back into your legs.  You develop new bowel or bladder control  problems.  You have unusual weakness or numbness in your arms or legs.  You develop nausea or vomiting.  You develop abdominal pain.  You feel faint. Document Released: 10/29/2005 Document Revised: 04/29/2012 Document Reviewed: 03/02/2014 [facility] Patient Information 2015 [place], [place]. This information is not intended to replace advice given to you by your health care provider. Make sure you discuss any questions you have with your health care provider.

## 2015-02-05 ENCOUNTER — Encounter (HOSPITAL_BASED_OUTPATIENT_CLINIC_OR_DEPARTMENT_OTHER): Payer: Self-pay | Admitting: Emergency Medicine

## 2015-02-05 ENCOUNTER — Emergency Department (HOSPITAL_BASED_OUTPATIENT_CLINIC_OR_DEPARTMENT_OTHER)
Admission: EM | Admit: 2015-02-05 | Discharge: 2015-02-05 | Disposition: A | Discharge status: Home / Self Care | Payer: Self-pay | Attending: Emergency Medicine | Admitting: Emergency Medicine

## 2015-02-05 DIAGNOSIS — I10 Essential (primary) hypertension: Secondary | ICD-10-CM | POA: Insufficient documentation

## 2015-02-05 DIAGNOSIS — L03011 Cellulitis of right finger: Secondary | ICD-10-CM | POA: Insufficient documentation

## 2015-02-05 DIAGNOSIS — Z79899 Other long term (current) drug therapy: Secondary | ICD-10-CM | POA: Insufficient documentation

## 2015-02-05 DIAGNOSIS — Z87891 Personal history of nicotine dependence: Secondary | ICD-10-CM | POA: Insufficient documentation

## 2015-02-05 MED ORDER — IBUPROFEN 800 MG PO TABS
800.0000 mg | ORAL_TABLET | Freq: Once | ORAL | Status: AC
  Administered 2015-02-05: 800 mg via ORAL
  Filled 2015-02-05: qty 1

## 2015-02-05 MED ORDER — IBUPROFEN 800 MG PO TABS: 800.0000 mg | ORAL_TABLET | Freq: Three times a day (TID) | ORAL | Status: DC

## 2015-02-05 NOTE — ED Provider Notes (Signed)
CSN: [id number]     Arrival date & time 02/05/15  0049 History   First MD Initiated Contact with Patient 02/05/15 0130     Chief Complaint  Patient presents with  . Finger Injury     (Consider location/radiation/quality/duration/timing/severity/associated sxs/prior Treatment) Patient is a 39 y.o. male presenting with abscess. The history is provided by the patient.  Abscess Location:  Finger Finger abscess location:  R ring finger Abscess quality: painful   Red streaking: no   Progression:  Worsening Pain details:    Quality:  Dull   Severity:  Moderate   Timing:  Constant   Progression:  Worsening Chronicity:  New Context: not diabetes   Relieved by:  Nothing Worsened by:  Nothing tried Ineffective treatments:  None tried Associated symptoms: no fever   Risk factors: no prior abscess     Past Medical History  Diagnosis Date  . Chest pain     Echo EF 50-55%. [facility] study 4/11: walked 9:15 no ecg changes. EF 42%?  inferoir attenuation no scar.   [name] Dyspnea   . Lightheadedness     with CP  . HTN (hypertension)    Past Surgical History  Procedure Laterality Date  . Fracture surgery    . Mandible fracture surgery     Family History  Problem Relation Age of Onset  . Coronary artery disease Maternal Grandfather   . Hypertension Mother    History  Substance Use Topics  . Smoking status: Former Smoker -- 0.50 packs/day    Types: Cigarettes    Quit date: 03/12/2013  . Smokeless tobacco: Former Systems developer     Comment: quit 4/11.  [name]. pt quit again 03/11/2013  . Alcohol Use: No    Review of Systems  Constitutional: Negative for fever.  All other systems reviewed and are negative.     Allergies  Review of patient's allergies indicates no known allergies.  Home Medications   Prior to Admission medications   Medication Sig Start Date End Date Taking? Authorizing Provider  HYDROcodone-acetaminophen (NORCO/VICODIN) 5-325 MG per tablet Take 2 tablets by mouth  every 4 (four) hours as needed. 01/18/15   [name], PA-C  ibuprofen (ADVIL,MOTRIN) 800 MG tablet Take 1 tablet (800 mg total) by mouth every 8 (eight) hours as needed for mild pain or moderate pain. 01/26/15   [name], PA-C  methocarbamol (ROBAXIN) 750 MG tablet Take 1 tablet (750 mg total) by mouth every 6 (six) hours as needed for muscle spasms (and pain). 01/26/15   [name], PA-C   BP 153/102 mmHg  Pulse 66  Temp(Src) 98.5 F (36.9 C) (Oral)  Resp 16  Wt 180 lb (81.647 kg)  SpO2 100% Physical Exam  Constitutional: He is oriented to person, place, and time. He appears well-developed and well-nourished. No distress.  HENT:  Head: Normocephalic and atraumatic.  Eyes: Conjunctivae and EOM are normal.  Neck: Normal range of motion. Neck supple.  Cardiovascular: Normal rate, regular rhythm and intact distal pulses.   Pulmonary/Chest: Effort normal and breath sounds normal. He has no wheezes. He has no rales.  Abdominal: Soft. Bowel sounds are normal. There is no tenderness. There is no rebound.  Musculoskeletal: Normal range of motion.       Hands: Neurological: He is alert and oriented to person, place, and time.  Skin: Skin is warm and dry.  Psychiatric: He has a normal mood and affect.    ED Course  Procedures (including critical care time) Labs Review Labs  Reviewed - No data to display  Imaging Review No results found.   EKG Interpretation None      MDM   Final diagnoses:  None    INCISION AND DRAINAGE Performed by: Carlisle Beers Consent: Verbal consent obtained. Risks and benefits: risks, benefits and alternatives were discussed Type: abscess  Body area: r ring finger paronychia  Incision was made with a scalpel.  Complexity: complex  Drainage: purulent  Drainage amount: moderate   Dressed with bacitracin and bulky dressing  Patient tolerance: Patient tolerated the procedure well with no immediate complications.       Veatrice Kells, MD 02/05/15 0201

## 2015-02-05 NOTE — ED Notes (Signed)
Patient has swelling and discoloration to right ring finger.

## 2015-02-20 ENCOUNTER — Encounter (HOSPITAL_COMMUNITY): Payer: Self-pay | Admitting: Emergency Medicine

## 2015-02-20 ENCOUNTER — Emergency Department (HOSPITAL_COMMUNITY)
Admission: EM | Admit: 2015-02-20 | Discharge: 2015-02-20 | Disposition: A | Discharge status: Home / Self Care | Payer: Self-pay | Attending: Emergency Medicine | Admitting: Emergency Medicine

## 2015-02-20 DIAGNOSIS — L03011 Cellulitis of right finger: Secondary | ICD-10-CM | POA: Insufficient documentation

## 2015-02-20 DIAGNOSIS — I10 Essential (primary) hypertension: Secondary | ICD-10-CM | POA: Insufficient documentation

## 2015-02-20 DIAGNOSIS — Z87891 Personal history of nicotine dependence: Secondary | ICD-10-CM | POA: Insufficient documentation

## 2015-02-20 DIAGNOSIS — IMO0001 Reserved for inherently not codable concepts without codable children: Secondary | ICD-10-CM

## 2015-02-20 NOTE — Discharge Instructions (Signed)
Return to the emergency room with worsening of symptoms, new symptoms or with symptoms that are concerning.  wash gently morning and night (every 12 hours) with soap and water. Use a topical antibiotic ointment and cover with a bandaid or gauze.    Do NOT use rubbing alcohol or hydrogen peroxide, do not soak the area   Every attempt was made to remove foreign body (contaminants) from the wound.  However, there is always a chance that some may remain in the wound. This can  increase your risk of infection.   If you see signs of infection (warmth, redness, tenderness, pus, sharp increase in pain, fever, red streaking in the skin) immediately return to the emergency department.   Read below information and follow recommendations.  Paronychia Paronychia is an inflammatory reaction involving the folds of the skin surrounding the fingernail. This is commonly caused by an infection in the skin around a nail. The most common cause of paronychia is frequent wetting of the hands (as seen with bartenders, food servers, nurses or others who wet their hands). This makes the skin around the fingernail susceptible to infection by bacteria (germs) or fungus. Other predisposing factors are:  Aggressive manicuring.  Nail biting.  Thumb sucking. The most common cause is a staphylococcal (a type of germ) infection, or a fungal (Candida) infection. When caused by a germ, it usually comes on suddenly with redness, swelling, pus and is often painful. It may get under the nail and form an abscess (collection of pus), or form an abscess around the nail. If the nail itself is infected with a fungus, the treatment is usually prolonged and may require oral medicine for up to one year. Your caregiver will determine the length of time treatment is required. The paronychia caused by bacteria (germs) may largely be avoided by not pulling on hangnails or picking at cuticles. When the infection occurs at the tips of the finger it  is called felon. When the cause of paronychia is from the herpes simplex virus (HSV) it is called herpetic whitlow. TREATMENT  When an abscess is present treatment is often incision and drainage. This means that the abscess must be cut open so the pus can get out. When this is done, the following home care instructions should be followed. HOME CARE INSTRUCTIONS   It is important to keep the affected fingers very dry. Rubber or plastic gloves over cotton gloves should be used whenever the hand must be placed in water.  Keep wound clean, dry and dressed as suggested by your caregiver between warm soaks or warm compresses.  Soak in warm water for fifteen to twenty minutes three to four times per day for bacterial infections. Fungal infections are very difficult to treat, so often require treatment for long periods of time.  For bacterial (germ) infections take antibiotics (medicine which kill germs) as directed and finish the prescription, even if the problem appears to be solved before the medicine is gone.  Only take over-the-counter or prescription medicines for pain, discomfort, or fever as directed by your caregiver. SEEK IMMEDIATE MEDICAL CARE IF:  You have redness, swelling, or increasing pain in the wound.  You notice pus coming from the wound.  You have a fever.  You notice a bad smell coming from the wound or dressing. Document Released: 04/24/2001 Document Revised: 01/21/2012 Document Reviewed: 12/24/2008 [facility] Patient Information 2015 [place], [place]. This information is not intended to replace advice given to you by your health care provider. Make sure  you discuss any questions you have with your health care provider.

## 2015-02-20 NOTE — ED Notes (Signed)
Pt states that he has infection in right ring finger is back again. Pt states that he had is drained about 2 weeks ago and was only given ibuprofen. 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

## 2015-02-20 NOTE — ED Provider Notes (Signed)
CSN: [id number]     Arrival date & time 02/20/15  1255 History  This chart was scribed for non-physician provider [name], PA-C, working with Malvin Johns, MD by Irene Pap, ED Scribe. This patient was seen in room [place] and patient care was started at 1:50 PM.   Chief Complaint  Patient presents with  . finger infection    The history is provided by the patient. No language interpreter was used.  HPI Comments: Carl Greene is a 39 y.o. male who presents to the Emergency Department complaining of a right ring finger infection onset two weeks. Patient states that he was seen two weeks ago in the ED and was given ibuprofen for paronychia to no relief. Patient states that he has been using warm soaks, but has not produced drainage or relief. Patient states that he has high blood pressure. Patient denies fever or chills. Patient denies history of diabetes. Patient states that he works at a [employer], where he works with his hand constantly and has possibly cut his hand on items at work. He states that he is UTD on Tdap.    Past Medical History  Diagnosis Date  . Chest pain     Echo EF 50-55%. [facility] study 4/11: walked 9:15 no ecg changes. EF 42%?  inferoir attenuation no scar.   [name] Dyspnea   . Lightheadedness     with CP  . HTN (hypertension)    Past Surgical History  Procedure Laterality Date  . Fracture surgery    . Mandible fracture surgery     Family History  Problem Relation Age of Onset  . Coronary artery disease Maternal Grandfather   . Hypertension Mother    History  Substance Use Topics  . Smoking status: Former Smoker -- 0.50 packs/day    Types: Cigarettes    Quit date: 03/12/2013  . Smokeless tobacco: Former Systems developer     Comment: quit 4/11.  [name]. pt quit again 03/11/2013  . Alcohol Use: No    Review of Systems  Constitutional: Negative for fever and chills.  Gastrointestinal: Negative for nausea and vomiting.  Skin: Positive for  wound. Negative for rash.   Allergies  Review of patient's allergies indicates no known allergies.  Home Medications   Prior to Admission medications   Medication Sig Start Date End Date Taking? Authorizing Provider  aspirin 325 MG tablet Take 650 mg by mouth every 6 (six) hours as needed for headache (headache).   Yes Historical Provider, MD  ibuprofen (ADVIL,MOTRIN) 800 MG tablet Take 1 tablet (800 mg total) by mouth every 8 (eight) hours as needed for mild pain or moderate pain. 01/26/15  Yes [name], PA-C  HYDROcodone-acetaminophen (NORCO/VICODIN) 5-325 MG per tablet Take 2 tablets by mouth every 4 (four) hours as needed. Patient not taking: Reported on 02/20/2015 01/18/15   [name], PA-C  ibuprofen (ADVIL,MOTRIN) 800 MG tablet Take 1 tablet (800 mg total) by mouth 3 (three) times daily. Patient not taking: Reported on 02/20/2015 02/05/15   April Palumbo, MD  methocarbamol (ROBAXIN) 750 MG tablet Take 1 tablet (750 mg total) by mouth every 6 (six) hours as needed for muscle spasms (and pain). Patient not taking: Reported on 02/20/2015 01/26/15   [name], PA-C   BP 157/97 mmHg  Pulse 70  Temp(Src) 97.8 F (36.6 C) (Oral)  Resp 18  SpO2 100%  Physical Exam  Constitutional: He appears well-developed and well-nourished. No distress.  HENT:  Head: Normocephalic and atraumatic.  Eyes: Conjunctivae are normal. Right eye exhibits no discharge. Left eye exhibits no discharge.  Pulmonary/Chest: Effort normal. No respiratory distress.  Neurological: He is alert. Coordination normal.  Skin: He is not diaphoretic.  Left fourth finger paronychea, radial side. No felon  Psychiatric: He has a normal mood and affect. His behavior is normal.  Nursing note and vitals reviewed.  ED Course  Procedures (including critical care time) DIAGNOSTIC STUDIES: Oxygen Saturation is 100% on room air, normal by my interpretation.    COORDINATION OF CARE: 1:52 PM-Discussed treatment plan which  includes I&D of area with saline soaks with pt at bedside and pt agreed to plan.   1:54 PM- INCISION AND DRAINAGE PROCEDURE NOTE: Patient identification was confirmed and verbal consent was obtained. This procedure was performed by [name], PA-C at 1:54 PM. Site: right fourth finger Anesthetic used (type and amt): none Incision with scalpel Drainage: moderate Complexity: Complex Packing used none Site anesthetized, incision made over site, wound drained  rinsed with copious amounts of normal saline, wound packed with sterile gauze, covered with sterile dressing.  Pt tolerated procedure well without complications.  Instructions for care discussed verbally and pt provided with additional written instructions for homecare and f/u.  Labs Review Labs Reviewed - No data to display  Imaging Review No results found.   EKG Interpretation None      MDM   Final diagnoses:  Paronychia of fourth finger, right   Patient with paronychia. IND performed and patient tolerated procedure well. Finger was soaked in normal saline. Patient without immunosuppression and no antibiotics indicated. Discussed warm soaks and follow up as needed.  Discussed return precautions with patient. Discussed all results and patient verbalizes understanding and agrees with plan.  I personally performed the services described in this documentation, which was scribed in my presence. The recorded information has been reviewed and is accurate.   [name], PA-C 02/20/15 1813  Malvin Johns, MD 02/25/15 [phone]

## 2015-05-01 ENCOUNTER — Emergency Department (HOSPITAL_COMMUNITY)
Admission: EM | Admit: 2015-05-01 | Discharge: 2015-05-01 | Disposition: A | Discharge status: Home / Self Care | Payer: Self-pay | Attending: Emergency Medicine | Admitting: Emergency Medicine

## 2015-05-01 ENCOUNTER — Encounter (HOSPITAL_COMMUNITY): Payer: Self-pay | Admitting: Emergency Medicine

## 2015-05-01 DIAGNOSIS — IMO0001 Reserved for inherently not codable concepts without codable children: Secondary | ICD-10-CM

## 2015-05-01 DIAGNOSIS — L03011 Cellulitis of right finger: Secondary | ICD-10-CM | POA: Insufficient documentation

## 2015-05-01 DIAGNOSIS — Z87891 Personal history of nicotine dependence: Secondary | ICD-10-CM | POA: Insufficient documentation

## 2015-05-01 DIAGNOSIS — I10 Essential (primary) hypertension: Secondary | ICD-10-CM | POA: Insufficient documentation

## 2015-05-01 MED ORDER — CEPHALEXIN 500 MG PO CAPS: 500.0000 mg | ORAL_CAPSULE | Freq: Four times a day (QID) | ORAL | Status: DC

## 2015-05-01 MED ORDER — SULFAMETHOXAZOLE-TRIMETHOPRIM 800-160 MG PO TABS: 1.0000 | ORAL_TABLET | Freq: Two times a day (BID) | ORAL | Status: AC

## 2015-05-01 NOTE — ED Provider Notes (Signed)
CSN: [id number]     Arrival date & time 05/01/15  1904 History  This chart was scribed for Doyce Loose, PA-C, working with Dorie Rank, MD by Starleen Arms, ED Scribe. This patient was seen in room TR08C/TR08C and the patient's care was started at 8:36 PM.   Chief Complaint  Patient presents with  . Hand Pain   The history is provided by the patient. No language interpreter was used.   HPI Comments: Carl Greene is a 39 y.o. male who presents to the Emergency Department complaining of right fourth finger swelling and pain described as throbbing onset 3-4 days ago.  The complaint onset shortly after the patient trimmed his finger nails.  Patient reports a previous history of paronychia x 3 in the same location.  He denies history of DM, HIV, hepatitis, or other chronic medical condtions  Past Medical History  Diagnosis Date  . Chest pain     Echo EF 50-55%. [facility] study 4/11: walked 9:15 no ecg changes. EF 42%?  inferoir attenuation no scar.   [name] Dyspnea   . Lightheadedness     with CP  . HTN (hypertension)    Past Surgical History  Procedure Laterality Date  . Fracture surgery    . Mandible fracture surgery     Family History  Problem Relation Age of Onset  . Coronary artery disease Maternal Grandfather   . Hypertension Mother    History  Substance Use Topics  . Smoking status: Former Smoker -- 0.50 packs/day    Types: Cigarettes    Quit date: 03/12/2013  . Smokeless tobacco: Former Systems developer     Comment: quit 4/11.  [name]. pt quit again 03/11/2013  . Alcohol Use: No    Review of Systems  Constitutional: Negative for fever and chills.  Skin: Positive for color change and wound.  Neurological: Positive for numbness. Negative for weakness.      Allergies  Review of patient's allergies indicates no known allergies.  Home Medications   Prior to Admission medications   Medication Sig Start Date End Date Taking? Authorizing Provider  aspirin 325 MG tablet Take 650 mg  by mouth every 6 (six) hours as needed for headache (headache).    Historical Provider, MD  cephALEXin (KEFLEX) 500 MG capsule Take 1 capsule (500 mg total) by mouth 4 (four) times daily. 05/01/15   [name], PA-C  HYDROcodone-acetaminophen (NORCO/VICODIN) 5-325 MG per tablet Take 2 tablets by mouth every 4 (four) hours as needed. Patient not taking: Reported on 02/20/2015 01/18/15   [name], PA-C  ibuprofen (ADVIL,MOTRIN) 800 MG tablet Take 1 tablet (800 mg total) by mouth every 8 (eight) hours as needed for mild pain or moderate pain. 01/26/15   [name], PA-C  ibuprofen (ADVIL,MOTRIN) 800 MG tablet Take 1 tablet (800 mg total) by mouth 3 (three) times daily. Patient not taking: Reported on 02/20/2015 02/05/15   April Palumbo, MD  methocarbamol (ROBAXIN) 750 MG tablet Take 1 tablet (750 mg total) by mouth every 6 (six) hours as needed for muscle spasms (and pain). Patient not taking: Reported on 02/20/2015 01/26/15   [name], PA-C  sulfamethoxazole-trimethoprim (BACTRIM DS,SEPTRA DS) 800-160 MG per tablet Take 1 tablet by mouth 2 (two) times daily. 05/01/15 05/08/15  [name], PA-C   BP 146/98 mmHg  Pulse 66  Temp(Src) 99.5 F (37.5 C) (Oral)  Resp 18  Ht 5\' 8"  (1.727 m)  Wt 178 lb (80.74 kg)  BMI 27.07 kg/m2  SpO2 100%  Physical Exam  Constitutional: He appears well-developed and well-nourished. No distress.  HENT:  Head: Normocephalic and atraumatic.  Eyes: Conjunctivae are normal. Right eye exhibits no discharge. Left eye exhibits no discharge.  Cardiovascular:  2+ radial pulses equal bilaterally.  < 3 second cap refill.     Pulmonary/Chest: Effort normal. No respiratory distress.  Neurological: He is alert. Coordination normal.  Strength and sensation intact.    Skin: He is not diaphoretic.  Patient with right 4th finger paronychia ulnar side without spontaneous drainage.  Associated erythema that is not circumferential but involves palmar aspect and distal tip of  finger with swelling  Psychiatric: He has a normal mood and affect. His behavior is normal.  Nursing note and vitals reviewed.   ED Course  Procedures (including critical care time)  DIAGNOSTIC STUDIES: Oxygen Saturation is 99% on RA, normal by my interpretation.    COORDINATION OF CARE:  8:40 PM Discussed plans to perform I&D.  Patient acknowledges and agrees with plan.    INCISION AND DRAINAGE PROCEDURE NOTE: Patient identification was confirmed and verbal consent was obtained. This procedure was performed by Doyce Loose, PA-C, at 8:50 PM. Site: Right fourth finger Sterile procedures observed: yes Anesthetic used (type and amt): none Blade size: 11 Drainage: moderate Complexity: Complex Packing used: 0.25" iodoform Site anesthetized, incision made over site, wound drained and explored loculations, rinsed with copious amounts of normal saline, wound packed with sterile gauze, covered with dry, sterile dressing.  Pt tolerated procedure well without complications.  Instructions for care discussed verbally and pt provided with additional written instructions for homecare and f/u.  Labs Review Labs Reviewed - No data to display  Imaging Review No results found.   EKG Interpretation None       Meds given in ED:  Medications - No data to display  Discharge Medication List as of 05/01/2015  9:37 PM    START taking these medications   Details  cephALEXin (KEFLEX) 500 MG capsule Take 1 capsule (500 mg total) by mouth 4 (four) times daily., Starting 05/01/2015, Until Discontinued, Print    sulfamethoxazole-trimethoprim (BACTRIM DS,SEPTRA DS) 800-160 MG per tablet Take 1 tablet by mouth 2 (two) times daily., Starting 05/01/2015, Until Sun 05/08/15, Print          MDM   Final diagnoses:  Paronychia of fourth finger, right   Patient with paronychia of 4th finger on right that is recurrent associated swelling and erythema with possibility of felon but could be related  to paronychia. Start patient on antibiotics and follow-up in 2 days with hand surgeon. If he is unable to make appointment he is to return to the ED for wound recheck. Case discussed with Dr. Amedeo Plenty who with agrees above plan.  Discussed return precautions with patient. Patient verbalizes understanding and agrees with plan.  This is a shared patient. This patient was discussed with the physician who saw and evaluated the patient and agrees with the plan.  I personally performed the services described in this documentation, which was scribed in my presence. The recorded information has been reviewed and is accurate.   [name], PA-C 05/02/15 3235  Dorie Rank, MD 05/02/15 [phone]

## 2015-05-01 NOTE — ED Notes (Signed)
Pt. Hand put in a tub of sterile saline

## 2015-05-01 NOTE — ED Notes (Signed)
Pt. reports pain/swelling at right distal 4th finger onset this week , denies injury/no discharge.

## 2015-05-01 NOTE — Discharge Instructions (Signed)
Return to the emergency room with worsening of symptoms, new symptoms or with symptoms that are concerning, especially fevers, redness swelling, numbness, tingling, weakness. Ibuprofen 400mg  (2 tablets 200mg ) every 5-6 hours for 3-5 days. Please take all of your antibiotics until finished!   You may develop abdominal discomfort or diarrhea from the antibiotic.  You may help offset this with probiotics which you can buy or get in yogurt. Do not eat  or take the probiotics until 2 hours after your antibiotic.  Call to make follow-up appointment with hand surgery in 1-2 days. Number provided above. If you're unable to follow-up come to the emergency room for wound recheck. Read below information and follow recommendations.  Paronychia Paronychia is an inflammatory reaction involving the folds of the skin surrounding the fingernail. This is commonly caused by an infection in the skin around a nail. The most common cause of paronychia is frequent wetting of the hands (as seen with bartenders, food servers, nurses or others who wet their hands). This makes the skin around the fingernail susceptible to infection by bacteria (germs) or fungus. Other predisposing factors are:  Aggressive manicuring.  Nail biting.  Thumb sucking. The most common cause is a staphylococcal (a type of germ) infection, or a fungal (Candida) infection. When caused by a germ, it usually comes on suddenly with redness, swelling, pus and is often painful. It may get under the nail and form an abscess (collection of pus), or form an abscess around the nail. If the nail itself is infected with a fungus, the treatment is usually prolonged and may require oral medicine for up to one year. Your caregiver will determine the length of time treatment is required. The paronychia caused by bacteria (germs) may largely be avoided by not pulling on hangnails or picking at cuticles. When the infection occurs at the tips of the finger it is called  felon. When the cause of paronychia is from the herpes simplex virus (HSV) it is called herpetic whitlow. TREATMENT  When an abscess is present treatment is often incision and drainage. This means that the abscess must be cut open so the pus can get out. When this is done, the following home care instructions should be followed. HOME CARE INSTRUCTIONS   It is important to keep the affected fingers very dry. Rubber or plastic gloves over cotton gloves should be used whenever the hand must be placed in water.  Keep wound clean, dry and dressed as suggested by your caregiver between warm soaks or warm compresses.  Soak in warm water for fifteen to twenty minutes three to four times per day for bacterial infections. Fungal infections are very difficult to treat, so often require treatment for long periods of time.  For bacterial (germ) infections take antibiotics (medicine which kill germs) as directed and finish the prescription, even if the problem appears to be solved before the medicine is gone.  Only take over-the-counter or prescription medicines for pain, discomfort, or fever as directed by your caregiver. SEEK IMMEDIATE MEDICAL CARE IF:  You have redness, swelling, or increasing pain in the wound.  You notice pus coming from the wound.  You have a fever.  You notice a bad smell coming from the wound or dressing. Document Released: 04/24/2001 Document Revised: 01/21/2012 Document Reviewed: 12/24/2008 [facility] Patient Information 2015 [place], [place]. This information is not intended to replace advice given to you by your health care provider. Make sure you discuss any questions you have with your health care provider.

## 2016-03-07 ENCOUNTER — Emergency Department (HOSPITAL_COMMUNITY)
Admission: EM | Admit: 2016-03-07 | Discharge: 2016-03-07 | Disposition: A | Discharge status: Home / Self Care | Payer: No Typology Code available for payment source | Attending: Emergency Medicine | Admitting: Emergency Medicine

## 2016-03-07 DIAGNOSIS — Z87891 Personal history of nicotine dependence: Secondary | ICD-10-CM | POA: Insufficient documentation

## 2016-03-07 DIAGNOSIS — S0083XA Contusion of other part of head, initial encounter: Secondary | ICD-10-CM | POA: Insufficient documentation

## 2016-03-07 DIAGNOSIS — I10 Essential (primary) hypertension: Secondary | ICD-10-CM | POA: Insufficient documentation

## 2016-03-07 DIAGNOSIS — W208XXA Other cause of strike by thrown, projected or falling object, initial encounter: Secondary | ICD-10-CM | POA: Insufficient documentation

## 2016-03-07 DIAGNOSIS — Y998 Other external cause status: Secondary | ICD-10-CM | POA: Insufficient documentation

## 2016-03-07 DIAGNOSIS — Z7982 Long term (current) use of aspirin: Secondary | ICD-10-CM | POA: Insufficient documentation

## 2016-03-07 DIAGNOSIS — Y9389 Activity, other specified: Secondary | ICD-10-CM | POA: Insufficient documentation

## 2016-03-07 DIAGNOSIS — Z79899 Other long term (current) drug therapy: Secondary | ICD-10-CM | POA: Insufficient documentation

## 2016-03-07 DIAGNOSIS — S0990XA Unspecified injury of head, initial encounter: Secondary | ICD-10-CM

## 2016-03-07 DIAGNOSIS — Y92009 Unspecified place in unspecified non-institutional (private) residence as the place of occurrence of the external cause: Secondary | ICD-10-CM | POA: Insufficient documentation

## 2016-03-07 NOTE — ED Notes (Addendum)
Patient was at home going in to the trunk. The trunk of the car fell on his front and back of head twice. Patient is complaining of pain of 2. Patient states he has put ice on it and it has reduced pain.

## 2016-03-07 NOTE — Discharge Instructions (Signed)

## 2016-03-07 NOTE — ED Notes (Signed)
Pt states that a car trunk fell on his head x 2 times an hour ago. States that he blacked out for 3-5 min alert and oriented MAE pupils equal and reactive.

## 2016-03-07 NOTE — ED Provider Notes (Signed)
CSN: [id number]     Arrival date & time 03/07/16  1825 History  By signing my name below, I, [facility], attest that this documentation has been prepared under the direction and in the presence of Loveda Colaizzi A Alano Blasco, PA-C. Electronically Signed: Virgel Bouquet, ED Scribe. 03/07/2016. 9:04 PM.   Chief Complaint  Patient presents with  . Head Injury   The history is provided by the patient. No language interpreter was used.   HPI Comments: Carl Greene is a 40 y.o. male with an hx of HTN who presents to the Emergency Department complaining of constant, moderate HA after an injury that occurred 2.5 hours ago. Patient states that he was reaching inside his trunk when the trunk came down and struck him in the head twice, followed immediately by pain and dizziness. He states that he blacked out for a couple of seconds and that wtinesses claimed he was staggering around. He reports intermittent nausea, completely resolved numbness, and lightheadedness since arriving in ED. He states that he is otherwise healthy. Denies numbness, CP, abdominal pain, vomiting, weakness.  Past Medical History  Diagnosis Date  . Chest pain     Echo EF 50-55%. [facility] study 4/11: walked 9:15 no ecg changes. EF 42%?  inferoir attenuation no scar.   [name] Dyspnea   . Lightheadedness     with CP  . HTN (hypertension)    Past Surgical History  Procedure Laterality Date  . Fracture surgery    . Mandible fracture surgery     Family History  Problem Relation Age of Onset  . Coronary artery disease Maternal Grandfather   . Hypertension Mother    Social History  Substance Use Topics  . Smoking status: Former Smoker -- 0.50 packs/day    Types: Cigarettes    Quit date: 03/12/2013  . Smokeless tobacco: Former Systems developer     Comment: quit 4/11.  [name]. pt quit again 03/11/2013  . Alcohol Use: No    Review of Systems  Eyes: Negative for visual disturbance.  Cardiovascular: Negative for chest pain.   Gastrointestinal: Positive for nausea. Negative for vomiting and abdominal pain.  Musculoskeletal: Negative for neck pain.  Skin: Negative for wound.  Neurological: Positive for dizziness, light-headedness and headaches. Negative for weakness and numbness.   Allergies  Review of patient's allergies indicates no known allergies.  Home Medications   Prior to Admission medications   Medication Sig Start Date End Date Taking? Authorizing Provider  amLODipine (NORVASC) 5 MG tablet Take 5 mg by mouth daily.   Yes Historical Provider, MD  aspirin 325 MG EC tablet Take 325 mg by mouth every 6 (six) hours as needed for pain.   Yes Historical Provider, MD  cephALEXin (KEFLEX) 500 MG capsule Take 1 capsule (500 mg total) by mouth 4 (four) times daily. Patient not taking: Reported on 03/07/2016 05/01/15   [name], PA-C  HYDROcodone-acetaminophen (NORCO/VICODIN) 5-325 MG per tablet Take 2 tablets by mouth every 4 (four) hours as needed. Patient not taking: Reported on 02/20/2015 01/18/15   [name], PA-C  ibuprofen (ADVIL,MOTRIN) 800 MG tablet Take 1 tablet (800 mg total) by mouth every 8 (eight) hours as needed for mild pain or moderate pain. Patient not taking: Reported on 03/07/2016 01/26/15   [name], PA-C  ibuprofen (ADVIL,MOTRIN) 800 MG tablet Take 1 tablet (800 mg total) by mouth 3 (three) times daily. Patient not taking: Reported on 02/20/2015 02/05/15   April Palumbo, MD  methocarbamol (ROBAXIN) 750 MG tablet Take  1 tablet (750 mg total) by mouth every 6 (six) hours as needed for muscle spasms (and pain). Patient not taking: Reported on 02/20/2015 01/26/15   [name], PA-C   BP 148/81 mmHg  Pulse 81  Temp(Src) 98 F (36.7 C) (Oral)  Resp 14  Ht 5\' 8"  (1.727 m)  Wt 185 lb (83.915 kg)  BMI 28.14 kg/m2  SpO2 99% Physical Exam  Constitutional: He is oriented to person, place, and time. He appears well-developed and well-nourished. No distress.  HENT:  Head: Normocephalic.   Small hematoma to the crown of head without wound. Mildly tender.  Eyes: Conjunctivae and EOM are normal. Pupils are equal, round, and reactive to light.   PERRL. Full range EOM  Neck: Normal range of motion.  Cardiovascular: Normal rate.   Pulmonary/Chest: Effort normal. No respiratory distress.  Musculoskeletal: Normal range of motion. He exhibits no tenderness.  No midline cervical tenderness. Full pain-free ROM of cervical spine.  Neurological: He is alert and oriented to person, place, and time.  Cranial nerves III-XII fully intact. No deficits of coordination. Speech clear and focused. Ambulatory without imbalance. Reflexes equal.  Skin: Skin is warm and dry.  Psychiatric: He has a normal mood and affect. His behavior is normal.  Nursing note and vitals reviewed.   ED Course  Procedures   DIAGNOSTIC STUDIES: Oxygen Saturation is 99% on RA, normal by my interpretation.    COORDINATION OF CARE: 8:22 PM Will consult with attending MD to discuss risks and benefits of performing a head CT. Discussed treatment plan with pt at bedside and pt agreed to plan.  8:49 PM Consulted with attending MD. Will no order head CT at this time. Will discharge pt home.    MDM   Final diagnoses:  Minor head injury, initial encounter    Patient has a normal neurologic exam after receiving a minor head injury with brief loss of consciousness. Discussed risk vs benefit of head CT scanning with the patient and family who are comfortable with discharge home and observation. Return precautions discussed.   I personally performed the services described in this documentation, which was scribed in my presence. The recorded information has been reviewed and is accurate.     Charlann Lange, PA-C 03/09/16 [id number]  Malvin Johns, MD 03/12/16 [phone]

## 2018-01-11 ENCOUNTER — Encounter (HOSPITAL_COMMUNITY): Payer: Self-pay | Admitting: Emergency Medicine

## 2018-01-11 ENCOUNTER — Emergency Department (HOSPITAL_COMMUNITY)
Admission: EM | Admit: 2018-01-11 | Discharge: 2018-01-11 | Disposition: A | Discharge status: Home / Self Care | Payer: Self-pay | Attending: Emergency Medicine | Admitting: Emergency Medicine

## 2018-01-11 DIAGNOSIS — I10 Essential (primary) hypertension: Secondary | ICD-10-CM | POA: Insufficient documentation

## 2018-01-11 DIAGNOSIS — K648 Other hemorrhoids: Secondary | ICD-10-CM | POA: Insufficient documentation

## 2018-01-11 DIAGNOSIS — Z87891 Personal history of nicotine dependence: Secondary | ICD-10-CM | POA: Insufficient documentation

## 2018-01-11 MED ORDER — LIDOCAINE HCL 2 % EX GEL: 1.0000 "application " | CUTANEOUS | 0 refills | Status: DC | PRN

## 2018-01-11 MED ORDER — HYDROCORTISONE ACETATE 25 MG RE SUPP: 25.0000 mg | Freq: Two times a day (BID) | RECTAL | 0 refills | Status: DC

## 2018-01-11 MED ORDER — LIDOCAINE HCL 2 % EX GEL
1.0000 "application " | Freq: Once | CUTANEOUS | Status: AC
  Administered 2018-01-11: 1 via TOPICAL
  Filled 2018-01-11: qty 5

## 2018-01-11 NOTE — Discharge Instructions (Signed)
Please see the information and instructions below regarding your visit.  Your diagnoses today include:  1. Prolapsed internal hemorrhoids     Tests performed today include: See side panel of your discharge paperwork for testing performed today. Vital signs are listed at the bottom of these instructions.   Medications prescribed:    Take any prescribed medications only as prescribed, and any over the counter medications only as directed on the packaging.  He may do Anusol suppositories as prescribed twice daily.  Please apply the lidocaine jelly 3 times a day as needed for discomfort.  Please make sure that you not inserted into the rectum.  This can be systemically absorbed.  Home care instructions:  Please follow any educational materials contained in this packet.   Please perform the sitz bath as indicated in this paperwork.  Follow-up instructions: Please follow-up with your primary care provider as soon as possible for further evaluation of your symptoms if they are not completely improved.   Please follow up with General Surgery listed on this paperwork.  Return instructions:  Please return to the Emergency Department if you experience worsening symptoms.  Please return to the emergency department for any significant rectal bleeding with blood mixed into the stool that will not stop, firmness around the rectum, or fever or chills with your symptoms. Please return if you have any other emergent concerns.  Additional Information:   Your vital signs today were: BP (!) 137/100 (BP Location: Right Arm)    Pulse (!) 106    Temp 97.9 F (36.6 C) (Oral)    Resp 18    SpO2 98%  If your blood pressure (BP) was elevated on multiple readings during this visit above 130 for the top number or above 80 for the bottom number, please have this repeated by your primary care provider within one month. --------------  Thank you for allowing [place] to participate in your care today.

## 2018-01-11 NOTE — ED Provider Notes (Signed)
[facility] DEPT Provider Note   CSN: [id number] Arrival date & time: 01/11/18  1512     History   Chief Complaint Chief Complaint  Patient presents with  . Hemorrhoids    HPI Carl Greene is a 42 y.o. male.  HPI   Patient is a 42 y.o. Male with a past medical history as below presenting for rectal pain and nodule.  Patient reports he is a remote history of having a hemorrhoid, but did not have pain like this.  Patient reports he had some rectal bleeding earlier this week, but is none currently.  Patient reports that the pain in his rectum has been occurring for 4 days.  Patient reports that he typically has 3-4 bowel movements a day, that are soft, however he has not had one in 3-4 days.  Patient denies any abdominal pain, or pain in the skin around his rectum.  Patient reports he has been trying over-the-counter Preparation H without relief.  Patient denies fever or chills with symptoms.  Patient denies any history of immunocompromise status.  Past Medical History:  Diagnosis Date  . Chest pain    Echo EF 50-55%. [facility] study 4/11: walked 9:15 no ecg changes. EF 42%?  inferoir attenuation no scar.   [name] Dyspnea   . HTN (hypertension)   . Lightheadedness    with CP    Patient Active Problem List   Diagnosis Date Noted  . Goiter 04/23/2011  . Tobacco use disorder 04/23/2011  . HYPERTENSION, BENIGN ESSENTIAL 03/07/2010  . DYSPNEA 03/07/2010  . CHEST PAIN-UNSPECIFIED 03/07/2010    Past Surgical History:  Procedure Laterality Date  . FRACTURE SURGERY    . MANDIBLE FRACTURE SURGERY         Home Medications    Prior to Admission medications   Medication Sig Start Date End Date Taking? Authorizing Provider  amLODipine (NORVASC) 5 MG tablet Take 5 mg by mouth daily.    [provider]  aspirin 325 MG EC tablet Take 325 mg by mouth every 6 (six) hours as needed for pain.    [provider]  cephALEXin (KEFLEX) 500 MG  capsule Take 1 capsule (500 mg total) by mouth 4 (four) times daily. Patient not taking: Reported on 03/07/2016 05/01/15   [name], PA-C  HYDROcodone-acetaminophen (NORCO/VICODIN) 5-325 MG per tablet Take 2 tablets by mouth every 4 (four) hours as needed. Patient not taking: Reported on 02/20/2015 01/18/15   [name], PA-C  ibuprofen (ADVIL,MOTRIN) 800 MG tablet Take 1 tablet (800 mg total) by mouth every 8 (eight) hours as needed for mild pain or moderate pain. Patient not taking: Reported on 03/07/2016 01/26/15   [name], PA-C  ibuprofen (ADVIL,MOTRIN) 800 MG tablet Take 1 tablet (800 mg total) by mouth 3 (three) times daily. Patient not taking: Reported on 02/20/2015 02/05/15   Palumbo, April, MD  methocarbamol (ROBAXIN) 750 MG tablet Take 1 tablet (750 mg total) by mouth every 6 (six) hours as needed for muscle spasms (and pain). Patient not taking: Reported on 02/20/2015 01/26/15   [name], PA-C    Family History Family History  Problem Relation Age of Onset  . Coronary artery disease Maternal Grandfather   . Hypertension Mother     Social History Social History   Tobacco Use  . Smoking status: Former Smoker    Packs/day: 0.50    Types: Cigarettes    Last attempt to quit: 03/12/2013    Years since quitting: 4.8  .  Smokeless tobacco: Former Systems developer  . Tobacco comment: quit 4/11.  //RESTARTED. pt quit again 03/11/2013  Substance Use Topics  . Alcohol use: No  . Drug use: Yes    Types: Marijuana    Comment: Uses weekly about 2-3 times.      Allergies   Patient has no known allergies.   Review of Systems Review of Systems  Constitutional: Negative for chills and fever.  Gastrointestinal: Positive for anal bleeding and rectal pain. Negative for abdominal pain, blood in stool, nausea and vomiting.  Genitourinary: Negative for flank pain.     Physical Exam Updated Vital Signs BP (!) 137/100 (BP Location: Right Arm)   Pulse (!) 106   Temp 97.9 F (36.6 C)  (Oral)   Resp 18   SpO2 98%   Physical Exam  Constitutional: He appears well-developed and well-nourished. No distress.  Sitting comfortably in bed.  HENT:  Head: Normocephalic and atraumatic.  Eyes: Conjunctivae are normal. Right eye exhibits no discharge. Left eye exhibits no discharge.  EOMs normal to gross examination.  Neck: Normal range of motion.  Cardiovascular: Normal rate and regular rhythm.  Intact, 2+ radial pulse.  Pulmonary/Chest:  Normal respiratory effort. Patient converses comfortably. No audible wheeze or stridor.  Abdominal: He exhibits no distension.  Genitourinary:  Genitourinary Comments: Examination performed with nurse chaperone, Katie, present.  There is a grade 4 prolapsed internal hemorrhoid extruding at approximately 6 o'clock position of the rectum.  No evidence of thrombosis.  No evidence of strangulated hemorrhoid.  Perirectal mucosa is soft.  Patient exhibits normal rectal tone.  Multiple slightly prolapsed hemorrhoids noted just inside the rectal canal.  Musculoskeletal: Normal range of motion.  Neurological: He is alert.  Cranial nerves intact to gross observation. Patient moves extremities without difficulty.  Skin: Skin is warm and dry. He is not diaphoretic.  Psychiatric: He has a normal mood and affect. His behavior is normal. Judgment and thought content normal.  Nursing note and vitals reviewed.    ED Treatments / Results  Labs (all labs ordered are listed, but only abnormal results are displayed) Labs Reviewed - No data to display  EKG  EKG Interpretation None       Radiology No results found.  Procedures Procedures (including critical care time)  Medications Ordered in ED Medications  lidocaine (XYLOCAINE) 2 % jelly 1 application (not administered)     Initial Impression / Assessment and Plan / ED Course  I have reviewed the triage vital signs and the nursing notes.  Pertinent labs & imaging results that were available  during my care of the patient were reviewed by me and considered in my medical decision making (see chart for details).     Patient is nontoxic-appearing, afebrile, but significantly uncomfortable due to his hemorrhoid.  Perirectal mucosa is soft.  Does not appear to have a rectal fissure.  Do not suspect other pathology as the cause of patient's rectal pain.  Recommend lidocaine jelly, as well as Anusol suppositories.  Patient instructed to perform sitz bath for 20 minutes of a times a day.  Patient instructed to return for any fever or chills with symptoms, pain surrounding the rectum, or significant rectal bleeding.  Patient is understanding agrees with plan of care.  This is a shared visit with Dr. Quintella Reichert. Patient was independently evaluated by this attending physician. Attending physician consulted in evaluation and discharge management.   Final Clinical Impressions(s) / ED Diagnoses   Final diagnoses:  Prolapsed internal hemorrhoids  ED Discharge Orders        Ordered    lidocaine (XYLOCAINE) 2 % jelly  As needed     01/11/18 2011    hydrocortisone (ANUSOL-HC) 25 MG suppository  2 times daily     01/11/18 2011       Tamala Julian 01/11/18 2014    Quintella Reichert, MD 01/12/18 [phone]

## 2018-01-11 NOTE — ED Triage Notes (Signed)
Patient c/o rectum pain r/t hemorrhoids x 3 days. Denies any bleeding. Using preparation h wipes with no relief.

## 2018-03-20 ENCOUNTER — Emergency Department (HOSPITAL_COMMUNITY)
Admission: EM | Admit: 2018-03-20 | Discharge: 2018-03-20 | Disposition: A | Discharge status: Home / Self Care | Payer: Self-pay | Attending: Emergency Medicine | Admitting: Emergency Medicine

## 2018-03-20 ENCOUNTER — Other Ambulatory Visit: Payer: Self-pay

## 2018-03-20 ENCOUNTER — Encounter (HOSPITAL_COMMUNITY): Payer: Self-pay | Admitting: Emergency Medicine

## 2018-03-20 DIAGNOSIS — Z79899 Other long term (current) drug therapy: Secondary | ICD-10-CM | POA: Insufficient documentation

## 2018-03-20 DIAGNOSIS — Y999 Unspecified external cause status: Secondary | ICD-10-CM | POA: Insufficient documentation

## 2018-03-20 DIAGNOSIS — Y92009 Unspecified place in unspecified non-institutional (private) residence as the place of occurrence of the external cause: Secondary | ICD-10-CM | POA: Insufficient documentation

## 2018-03-20 DIAGNOSIS — Y9389 Activity, other specified: Secondary | ICD-10-CM | POA: Insufficient documentation

## 2018-03-20 DIAGNOSIS — I10 Essential (primary) hypertension: Secondary | ICD-10-CM | POA: Insufficient documentation

## 2018-03-20 DIAGNOSIS — Z87891 Personal history of nicotine dependence: Secondary | ICD-10-CM | POA: Insufficient documentation

## 2018-03-20 DIAGNOSIS — X58XXXA Exposure to other specified factors, initial encounter: Secondary | ICD-10-CM | POA: Insufficient documentation

## 2018-03-20 DIAGNOSIS — T162XXA Foreign body in left ear, initial encounter: Secondary | ICD-10-CM | POA: Insufficient documentation

## 2018-03-20 DIAGNOSIS — S00452A Superficial foreign body of left ear, initial encounter: Secondary | ICD-10-CM

## 2018-03-20 NOTE — ED Provider Notes (Signed)
[facility] EMERGENCY DEPARTMENT Provider Note   CSN: [id number] Arrival date & time: 03/20/18  1803     History   Chief Complaint No chief complaint on file.   HPI Carl Greene is a 42 y.o. male.  Patient was cleaning his ears at home, with the cotton tip from a q-tip lodged in the left ear. Patient with mild pain and muffled hearing.  The history is provided by the patient. No language interpreter was used.  Otalgia  This is a new problem. The current episode started 3 to 5 hours ago. There is pain in the left ear. There has been no fever. The pain is mild. His past medical history is significant for tympanostomy tube.    Past Medical History:  Diagnosis Date  . Chest pain    Echo EF 50-55%. [facility] study 4/11: walked 9:15 no ecg changes. EF 42%?  inferoir attenuation no scar.   [name] Dyspnea   . HTN (hypertension)   . Lightheadedness    with CP    Patient Active Problem List   Diagnosis Date Noted  . Goiter 04/23/2011  . Tobacco use disorder 04/23/2011  . HYPERTENSION, BENIGN ESSENTIAL 03/07/2010  . DYSPNEA 03/07/2010  . CHEST PAIN-UNSPECIFIED 03/07/2010    Past Surgical History:  Procedure Laterality Date  . FRACTURE SURGERY    . MANDIBLE FRACTURE SURGERY          Home Medications    Prior to Admission medications   Medication Sig Start Date End Date Taking? Authorizing Provider  amLODipine (NORVASC) 5 MG tablet Take 5 mg by mouth daily.    [provider]  aspirin 325 MG EC tablet Take 325 mg by mouth every 6 (six) hours as needed for pain.    [provider]  cephALEXin (KEFLEX) 500 MG capsule Take 1 capsule (500 mg total) by mouth 4 (four) times daily. Patient not taking: Reported on 03/07/2016 05/01/15   [name], PA-C  HYDROcodone-acetaminophen (NORCO/VICODIN) 5-325 MG per tablet Take 2 tablets by mouth every 4 (four) hours as needed. Patient not taking: Reported on 02/20/2015 01/18/15   [name], PA-C    hydrocortisone (ANUSOL-HC) 25 MG suppository Place 1 suppository (25 mg total) rectally 2 (two) times daily. 01/11/18   [name], PA-C  ibuprofen (ADVIL,MOTRIN) 800 MG tablet Take 1 tablet (800 mg total) by mouth every 8 (eight) hours as needed for mild pain or moderate pain. Patient not taking: Reported on 03/07/2016 01/26/15   [name], PA-C  ibuprofen (ADVIL,MOTRIN) 800 MG tablet Take 1 tablet (800 mg total) by mouth 3 (three) times daily. Patient not taking: Reported on 02/20/2015 02/05/15   Palumbo, April, MD  lidocaine (XYLOCAINE) 2 % jelly Apply 1 application topically as needed. Apply once every 8 hours as needed for discomfort. 01/11/18   [name], PA-C  methocarbamol (ROBAXIN) 750 MG tablet Take 1 tablet (750 mg total) by mouth every 6 (six) hours as needed for muscle spasms (and pain). Patient not taking: Reported on 02/20/2015 01/26/15   [name], PA-C    Family History Family History  Problem Relation Age of Onset  . Coronary artery disease Maternal Grandfather   . Hypertension Mother     Social History Social History   Tobacco Use  . Smoking status: Former Smoker    Packs/day: 0.50    Types: Cigarettes    Last attempt to quit: 03/12/2013    Years since quitting: 5.0  . Smokeless tobacco:  Former Systems developer  . Tobacco comment: quit 4/11.  //RESTARTED. pt quit again 03/11/2013  Substance Use Topics  . Alcohol use: No  . Drug use: Yes    Types: Marijuana    Comment: Uses weekly about 2-3 times.      Allergies   Patient has no known allergies.   Review of Systems Review of Systems  HENT: Positive for ear pain.   All other systems reviewed and are negative.    Physical Exam Updated Vital Signs BP (!) 127/92 (BP Location: Right Arm)   Pulse 72   Temp 98.7 F (37.1 C) (Oral)   Resp 16   SpO2 100%   Physical Exam  Constitutional: He is oriented to person, place, and time. He appears well-developed and well-nourished.  HENT:  Head: Atraumatic.   Foreign body (cotton tip) in left ear canal. Removed via alligator forceps. TM normal.  Eyes: Conjunctivae are normal.  Cardiovascular: Normal rate and regular rhythm.  Pulmonary/Chest: Effort normal and breath sounds normal.  Neurological: He is alert and oriented to person, place, and time.  Skin: Skin is warm and dry.  Psychiatric: He has a normal mood and affect.  Nursing note and vitals reviewed.    ED Treatments / Results  Labs (all labs ordered are listed, but only abnormal results are displayed) Labs Reviewed - No data to display  EKG None  Radiology No results found.  Procedures .Foreign Body Removal Date/Time: 03/20/2018 9:06 PM Performed by: Etta Quill, NP Authorized by: Etta Quill, NP  Consent: Verbal consent obtained. Risks and benefits: risks, benefits and alternatives were discussed Consent given by: patient Patient identity confirmed: verbally with patient Body area: ear Patient cooperative: yes Localization method: ENT speculum Removal mechanism: alligator forceps Complexity: simple 1 objects recovered. Objects recovered: cotton tip Post-procedure assessment: foreign body removed Patient tolerance: Patient tolerated the procedure well with no immediate complications   (including critical care time)  Medications Ordered in ED Medications - No data to display   Initial Impression / Assessment and Plan / ED Course  I have reviewed the triage vital signs and the nursing notes.  Pertinent labs & imaging results that were available during my care of the patient were reviewed by me and considered in my medical decision making (see chart for details).     Foreign body removal from left ear canal. There does not appear to be any associated infection or injury. Care instructions provided and return precautions discussed.    Final Clinical Impressions(s) / ED Diagnoses   Final diagnoses:  Foreign body in ear lobe, left, initial encounter    ED  Discharge Orders    None       Etta Quill, NP 03/21/18 0013    Deno Etienne, DO 03/21/18 1533

## 2018-03-20 NOTE — ED Triage Notes (Signed)
Pt states he has cotton stuck in his left ear from a Qtip. Pt here to have it removed

## 2018-10-30 ENCOUNTER — Emergency Department (HOSPITAL_COMMUNITY)
Admission: EM | Admit: 2018-10-30 | Discharge: 2018-10-31 | Disposition: A | Discharge status: Home / Self Care | Payer: Self-pay | Attending: Emergency Medicine | Admitting: Emergency Medicine

## 2018-10-30 ENCOUNTER — Encounter (HOSPITAL_COMMUNITY): Payer: Self-pay | Admitting: *Deleted

## 2018-10-30 ENCOUNTER — Other Ambulatory Visit: Payer: Self-pay

## 2018-10-30 DIAGNOSIS — Z23 Encounter for immunization: Secondary | ICD-10-CM | POA: Insufficient documentation

## 2018-10-30 DIAGNOSIS — S0993XA Unspecified injury of face, initial encounter: Secondary | ICD-10-CM

## 2018-10-30 DIAGNOSIS — S01511A Laceration without foreign body of lip, initial encounter: Secondary | ICD-10-CM | POA: Insufficient documentation

## 2018-10-30 DIAGNOSIS — Z87891 Personal history of nicotine dependence: Secondary | ICD-10-CM | POA: Insufficient documentation

## 2018-10-30 DIAGNOSIS — I10 Essential (primary) hypertension: Secondary | ICD-10-CM | POA: Insufficient documentation

## 2018-10-30 DIAGNOSIS — Y9289 Other specified places as the place of occurrence of the external cause: Secondary | ICD-10-CM | POA: Insufficient documentation

## 2018-10-30 DIAGNOSIS — Y9389 Activity, other specified: Secondary | ICD-10-CM | POA: Insufficient documentation

## 2018-10-30 DIAGNOSIS — Y998 Other external cause status: Secondary | ICD-10-CM | POA: Insufficient documentation

## 2018-10-30 MED ORDER — TETANUS-DIPHTH-ACELL PERTUSSIS 5-2.5-18.5 LF-MCG/0.5 IM SUSP
0.5000 mL | Freq: Once | INTRAMUSCULAR | Status: AC
  Administered 2018-10-30: 0.5 mL via INTRAMUSCULAR
  Filled 2018-10-30: qty 0.5

## 2018-10-30 MED ORDER — LIDOCAINE HCL 2 % IJ SOLN
10.0000 mL | Freq: Once | INTRAMUSCULAR | Status: AC
  Administered 2018-10-30: 200 mg
  Filled 2018-10-30: qty 20
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
## 2018-10-30 NOTE — ED Notes (Signed)
LIDO at bedside

## 2018-10-31 MED ORDER — AMOXICILLIN-POT CLAVULANATE 875-125 MG PO TABS: 1.0000 | ORAL_TABLET | Freq: Two times a day (BID) | ORAL | 0 refills | Status: AC

## 2018-10-31 NOTE — Discharge Instructions (Addendum)
Take antibiotics as prescribed.  Take the entire course. Use Tylenol and ibuprofen as needed for pain or swelling.  Use ice packs to help with pain and swelling. Do not drink or eat out of a straw or anything that creates increased pressure on the lips. Avoid irritating the cut on your lip with your tongue. Return to the emergency room if you develop fevers, worsening bleeding, worsening pain, or any new, concerning, worsening symptoms.
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
## 2020-04-02 ENCOUNTER — Other Ambulatory Visit: Payer: Self-pay

## 2020-04-02 ENCOUNTER — Emergency Department (HOSPITAL_BASED_OUTPATIENT_CLINIC_OR_DEPARTMENT_OTHER): Payer: Self-pay

## 2020-04-02 ENCOUNTER — Emergency Department (HOSPITAL_BASED_OUTPATIENT_CLINIC_OR_DEPARTMENT_OTHER)
Admission: EM | Admit: 2020-04-02 | Discharge: 2020-04-02 | Disposition: A | Discharge status: Home / Self Care | Payer: Self-pay | Attending: Emergency Medicine | Admitting: Emergency Medicine

## 2020-04-02 ENCOUNTER — Encounter (HOSPITAL_BASED_OUTPATIENT_CLINIC_OR_DEPARTMENT_OTHER): Payer: Self-pay | Admitting: Emergency Medicine

## 2020-04-02 DIAGNOSIS — I10 Essential (primary) hypertension: Secondary | ICD-10-CM | POA: Insufficient documentation

## 2020-04-02 DIAGNOSIS — Z7982 Long term (current) use of aspirin: Secondary | ICD-10-CM | POA: Insufficient documentation

## 2020-04-02 DIAGNOSIS — Z79899 Other long term (current) drug therapy: Secondary | ICD-10-CM | POA: Insufficient documentation

## 2020-04-02 DIAGNOSIS — E876 Hypokalemia: Secondary | ICD-10-CM | POA: Insufficient documentation

## 2020-04-02 DIAGNOSIS — R0789 Other chest pain: Secondary | ICD-10-CM | POA: Insufficient documentation

## 2020-04-02 DIAGNOSIS — Z87891 Personal history of nicotine dependence: Secondary | ICD-10-CM | POA: Insufficient documentation

## 2020-04-02 LAB — CBC
HCT: 38.2 % — ABNORMAL LOW (ref 39.0–52.0)
Hemoglobin: 12.8 g/dL — ABNORMAL LOW (ref 13.0–17.0)
MCH: 27.5 pg (ref 26.0–34.0)
MCHC: 33.5 g/dL (ref 30.0–36.0)
MCV: 82 fL (ref 80.0–100.0)
Platelets: 212 10*3/uL (ref 150–400)
RBC: 4.66 MIL/uL (ref 4.22–5.81)
RDW: 15.2 % (ref 11.5–15.5)
WBC: 6.7 10*3/uL (ref 4.0–10.5)
nRBC: 0 % (ref 0.0–0.2)

## 2020-04-02 LAB — BASIC METABOLIC PANEL
Anion gap: 11 (ref 5–15)
BUN: 9 mg/dL (ref 6–20)
CO2: 25 mmol/L (ref 22–32)
Calcium: 8.5 mg/dL — ABNORMAL LOW (ref 8.9–10.3)
Chloride: 102 mmol/L (ref 98–111)
Creatinine, Ser: 0.78 mg/dL (ref 0.61–1.24)
GFR calc Af Amer: >60 mL/min (ref >60)
GFR calc non Af Amer: >60 mL/min (ref >60)
Glucose, Bld: 120 mg/dL — ABNORMAL HIGH (ref 70–99)
Potassium: 2.9 mmol/L — ABNORMAL LOW (ref 3.5–5.1)
Sodium: 138 mmol/L (ref 135–145)

## 2020-04-02 LAB — TROPONIN I (HIGH SENSITIVITY): Troponin I (High Sensitivity): 5 ng/L (ref <18)

## 2020-04-02 LAB — D-DIMER, QUANTITATIVE: D-Dimer, Quant: 0.52 ug/mL-FEU — ABNORMAL HIGH (ref 0.00–0.50)

## 2020-04-02 LAB — MAGNESIUM: Magnesium: 2 mg/dL (ref 1.7–2.4)

## 2020-04-02 MED ORDER — IOHEXOL 350 MG/ML SOLN
100.0000 mL | Freq: Once | INTRAVENOUS | Status: AC | PRN
  Administered 2020-04-02: 100 mL via INTRAVENOUS

## 2020-04-02 MED ORDER — IBUPROFEN 600 MG PO TABS
600.0000 mg | ORAL_TABLET | Freq: Three times a day (TID) | ORAL | 0 refills | Status: DC | PRN
Start: 2020-04-02 — End: 2020-04-02

## 2020-04-02 MED ORDER — POTASSIUM CHLORIDE ER 20 MEQ PO TBCR
20.0000 meq | EXTENDED_RELEASE_TABLET | Freq: Every day | ORAL | 0 refills | Status: DC
Start: 2020-04-02 — End: 2021-12-14

## 2020-04-02 MED ORDER — POTASSIUM CHLORIDE CRYS ER 20 MEQ PO TBCR
40.0000 meq | EXTENDED_RELEASE_TABLET | Freq: Once | ORAL | Status: AC
  Administered 2020-04-02: 40 meq via ORAL
  Filled 2020-04-02: qty 2

## 2020-04-02 MED ORDER — IBUPROFEN 600 MG PO TABS: 600.0000 mg | ORAL_TABLET | Freq: Three times a day (TID) | ORAL | 0 refills | Status: DC | PRN

## 2020-04-02 MED ORDER — AMLODIPINE BESYLATE 5 MG PO TABS: 5.0000 mg | ORAL_TABLET | Freq: Every day | ORAL | 0 refills | Status: DC

## 2020-04-02 MED ORDER — KETOROLAC TROMETHAMINE 30 MG/ML IJ SOLN
30.0000 mg | Freq: Once | INTRAMUSCULAR | Status: DC
  Filled 2020-04-02: qty 1

## 2020-04-02 MED ORDER — KETOROLAC TROMETHAMINE 60 MG/2ML IM SOLN
30.0000 mg | Freq: Once | INTRAMUSCULAR | Status: AC
  Administered 2020-04-02: 30 mg via INTRAMUSCULAR

## 2020-04-02 NOTE — ED Provider Notes (Signed)
[place] EMERGENCY DEPARTMENT Provider Note   CSN: [id number] Arrival date & time: 04/02/20  1651     History Chief Complaint  Patient presents with  . Chest Pain    Carl Greene is a 44 y.o. male.  Pt presents to the ED today with cp.  Pt said the pain is on the left side of his chest and hurts with a deep breath.  He does remember lifting something heavy at work and the pain started hurting after that.  Pt said cp started last night.  The pt denies any other associated sx.  Pt does have a hx of htn, but has not been on his norvasc in "awhile" because he does not have a pcp.        Past Medical History:  Diagnosis Date  . Chest pain    Echo EF 50-55%. [facility] study 4/11: walked 9:15 no ecg changes. EF 42%?  inferoir attenuation no scar.   [name] Dyspnea   . HTN (hypertension)   . Lightheadedness    with CP    Patient Active Problem List   Diagnosis Date Noted  . Goiter 04/23/2011  . Tobacco use disorder 04/23/2011  . HYPERTENSION, BENIGN ESSENTIAL 03/07/2010  . DYSPNEA 03/07/2010  . CHEST PAIN-UNSPECIFIED 03/07/2010    Past Surgical History:  Procedure Laterality Date  . FRACTURE SURGERY    . MANDIBLE FRACTURE SURGERY         Family History  Problem Relation Age of Onset  . Coronary artery disease Maternal Grandfather   . Hypertension Mother     Social History   Tobacco Use  . Smoking status: Former Smoker    Packs/day: 0.50    Types: Cigarettes    Quit date: 03/12/2013    Years since quitting: 7.0  . Smokeless tobacco: Former Systems developer  . Tobacco comment: quit 4/11.  //RESTARTED. pt quit again 03/11/2013  Substance Use Topics  . Alcohol use: No  . Drug use: Yes    Types: Marijuana    Comment: Uses weekly about 2-3 times.     Home Medications Prior to Admission medications   Medication Sig Start Date End Date Taking? Authorizing Provider  amLODipine (NORVASC) 5 MG tablet Take 1 tablet (5 mg total) by mouth daily. 04/02/20   Isla Pence, MD  aspirin 325 MG EC tablet Take 325 mg by mouth every 6 (six) hours as needed for pain.    [provider]  cephALEXin (KEFLEX) 500 MG capsule Take 1 capsule (500 mg total) by mouth 4 (four) times daily. Patient not taking: Reported on 03/07/2016 05/01/15   [name], PA-C  HYDROcodone-acetaminophen (NORCO/VICODIN) 5-325 MG per tablet Take 2 tablets by mouth every 4 (four) hours as needed. Patient not taking: Reported on 02/20/2015 01/18/15   [name], PA-C  hydrocortisone (ANUSOL-HC) 25 MG suppository Place 1 suppository (25 mg total) rectally 2 (two) times daily. 01/11/18   [name], PA-C  ibuprofen (ADVIL) 600 MG tablet Take 1 tablet (600 mg total) by mouth every 8 (eight) hours as needed for mild pain or moderate pain. 04/02/20   Isla Pence, MD  lidocaine (XYLOCAINE) 2 % jelly Apply 1 application topically as needed. Apply once every 8 hours as needed for discomfort. 01/11/18   [name], PA-C  methocarbamol (ROBAXIN) 750 MG tablet Take 1 tablet (750 mg total) by mouth every 6 (six) hours as needed for muscle spasms (and pain). Patient not taking: Reported on 02/20/2015 01/26/15  West, [name], PA-C  potassium chloride 20 MEQ TBCR Take 20 mEq by mouth daily. 04/02/20   Isla Pence, MD    Allergies    Patient has no known allergies.  Review of Systems   Review of Systems  Cardiovascular: Positive for chest pain.  All other systems reviewed and are negative.   Physical Exam Updated Vital Signs BP (!) 146/96 (BP Location: Left Arm) Comment: out of BP meds for "awhile"  Pulse 86   Temp 98.6 F (37 C) (Oral)   Resp 18   Ht 5\' 8"  (1.727 m)   Wt 72.1 kg   SpO2 100%   BMI 24.18 kg/m   Physical Exam Vitals and nursing note reviewed.  Constitutional:      Appearance: He is well-developed.  HENT:     Head: Normocephalic and atraumatic.  Eyes:     Extraocular Movements: Extraocular movements intact.     Pupils: Pupils are equal, round, and  reactive to light.  Cardiovascular:     Rate and Rhythm: Normal rate and regular rhythm.     Heart sounds: Normal heart sounds.  Pulmonary:     Effort: Pulmonary effort is normal.     Breath sounds: Normal breath sounds.  Chest:    Abdominal:     General: Bowel sounds are normal.     Palpations: Abdomen is soft.  Musculoskeletal:        General: Normal range of motion.     Cervical back: Normal range of motion and neck supple.  Skin:    General: Skin is warm.     Capillary Refill: Capillary refill takes less than 2 seconds.  Neurological:     General: No focal deficit present.     Mental Status: He is alert and oriented to person, place, and time.  Psychiatric:        Mood and Affect: Mood normal.        Behavior: Behavior normal.     ED Results / Procedures / Treatments   Labs (all labs ordered are listed, but only abnormal results are displayed) Labs Reviewed  BASIC METABOLIC PANEL - Abnormal; Notable for the following components:      Result Value   Potassium 2.9 (*)    Glucose, Bld 120 (*)    Calcium 8.5 (*)    All other components within normal limits  CBC - Abnormal; Notable for the following components:   Hemoglobin 12.8 (*)    HCT 38.2 (*)    All other components within normal limits  D-DIMER, QUANTITATIVE (NOT AT [facility]) - Abnormal; Notable for the following components:   D-Dimer, Quant 0.52 (*)    All other components within normal limits  MAGNESIUM  TROPONIN I (HIGH SENSITIVITY)  TROPONIN I (HIGH SENSITIVITY)    EKG EKG Interpretation  Date/Time:  Saturday Apr 02 2020 16:55:37 EDT Ventricular Rate:  82 PR Interval:  134 QRS Duration: 98 QT Interval:  412 QTC Calculation: 481 R Axis:   63 Text Interpretation: Normal sinus rhythm T wave abnormality, consider inferior ischemia T wave abnormality, consider anterolateral ischemia Prolonged QT Abnormal ECG No significant change since last tracing Confirmed by Isla Pence [phone]) on 04/02/2020 5:28:58  PM   Radiology DG Chest 2 View  Result Date: 04/02/2020 CLINICAL DATA:  Chest pain.  Shortness of breath. EXAM: CHEST - 2 VIEW COMPARISON:  Mar 18, 2013 FINDINGS: The heart size and mediastinal contours are within normal limits. Both lungs are clear. The visualized skeletal structures are unremarkable.  IMPRESSION: No active cardiopulmonary disease. Electronically Signed   By: Dorise Bullion III M.D   On: 04/02/2020 17:22   CT Angio Chest PE W and/or Wo Contrast  Result Date: 04/02/2020 CLINICAL DATA:  PE suspected, high pretest probability, 1 day of chest pain worse with inspiration. EXAM: CT ANGIOGRAPHY CHEST WITH CONTRAST TECHNIQUE: Multidetector CT imaging of the chest was performed using the standard protocol during bolus administration of intravenous contrast. Multiplanar CT image reconstructions and MIPs were obtained to evaluate the vascular anatomy. CONTRAST:  125mL OMNIPAQUE IOHEXOL 350 MG/ML SOLN COMPARISON:  Chest radiograph 04/02/2020 FINDINGS: Cardiovascular: Satisfactory opacification the pulmonary arteries to the segmental level. No pulmonary artery filling defects are identified. Central pulmonary arteries are normal caliber. Normal heart size. No pericardial effusion. The aorta is normal caliber. Normal 3 vessel branching of the aortic arch proximal great vessels are unremarkable. Mediastinum/Nodes: No mediastinal fluid or gas. Normal thyroid gland and thoracic inlet. No acute abnormality of the trachea or esophagus. No worrisome mediastinal, hilar or axillary adenopathy. Lungs/Pleura: Atelectatic changes are no dependent portions of the lungs. No consolidation, features of edema, pneumothorax, or effusion. No suspicious pulmonary nodules or masses. Upper Abdomen: No acute abnormalities present in the visualized portions of the upper abdomen. Musculoskeletal: No chest wall abnormality. No acute or significant osseous findings. Review of the MIP images confirms the above findings.  IMPRESSION: No evidence for pulmonary embolus or other acute intrathoracic process. Mild atelectatic changes possibly secondary to splinting. Electronically Signed   By: Lovena Le M.D.   On: 04/02/2020 19:13    Procedures Procedures (including critical care time)  Medications Ordered in ED Medications  ketorolac (TORADOL) 30 MG/ML injection 30 mg (30 mg Intravenous Not Given 04/02/20 1824)  potassium chloride SA (KLOR-CON) CR tablet 40 mEq (40 mEq Oral Given 04/02/20 1824)  ketorolac (TORADOL) injection 30 mg (30 mg Intramuscular Given 04/02/20 1840)  iohexol (OMNIPAQUE) 350 MG/ML injection 100 mL (100 mLs Intravenous Contrast Given 04/02/20 1900)    ED Course  I have reviewed the triage vital signs and the nursing notes.  Pertinent labs & imaging results that were available during my care of the patient were reviewed by me and considered in my medical decision making (see chart for details).    MDM Rules/Calculators/A&P                      Pt's cp sounds very atypical.  CTA neg for PE.  Pt's K is low and and it is replaced.  Pain is gone after toradol. Pt will be given a rx for norvasc and is instructed to eat a DASH diet.   Pt is stable for d/c.  Return if worse.  Heart Score:  2 Final Clinical Impression(s) / ED Diagnoses Final diagnoses:  Atypical chest pain  Hypokalemia  Essential hypertension    Rx / DC Orders ED Discharge Orders         Ordered    amLODipine (NORVASC) 5 MG tablet  Daily     04/02/20 1924    ibuprofen (ADVIL) 600 MG tablet  Every 8 hours PRN,   Status:  Discontinued     04/02/20 1924    potassium chloride 20 MEQ TBCR  Daily     04/02/20 1924    ibuprofen (ADVIL) 600 MG tablet  Every 8 hours PRN     04/02/20 1925           Isla Pence, MD 04/02/20 1928

## 2020-04-02 NOTE — ED Notes (Signed)
Pt ambulatory to d/c window with steady gait. Verbalized understanding to pick up Rx at pharmacy listed on d/c instructions. Work note given per VORB from Dr. Gilford Raid

## 2020-04-02 NOTE — ED Triage Notes (Signed)
Chest pain since last night. Worse with inspiration.

## 2020-09-03 ENCOUNTER — Ambulatory Visit
Admission: EM | Admit: 2020-09-03 | Discharge: 2020-09-03 | Disposition: A | Discharge status: Home / Self Care | Payer: Self-pay | Attending: Emergency Medicine | Admitting: Emergency Medicine

## 2020-09-03 ENCOUNTER — Encounter: Payer: Self-pay | Admitting: Emergency Medicine

## 2020-09-03 ENCOUNTER — Other Ambulatory Visit: Payer: Self-pay

## 2020-09-03 DIAGNOSIS — H60392 Other infective otitis externa, left ear: Secondary | ICD-10-CM

## 2020-09-03 MED ORDER — NEOMYCIN-POLYMYXIN-HC 3.5-10000-1 OT SOLN
3.0000 [drp] | Freq: Three times a day (TID) | OTIC | 0 refills | Status: DC
Start: 2020-09-03 — End: 2021-12-14

## 2020-09-03 NOTE — Discharge Instructions (Addendum)
Use eardrops as prescribed for the next week. Return for worsening ear pain, swelling, discharge, bleeding, decreased hearing, development of jaw pain/swelling, fever.  Do NOT use Q-tips as these can cause your ear wax to get stuck, the tips may break off and become a foreign body requiring additional medical care, or puncture your eardrum.  Helpful prevention tip: Use a solution of equal parts isopropyl (rubbing) alcohol and white vinegar (acetic acid) in both ears after swimming. 

## 2020-09-03 NOTE — ED Provider Notes (Signed)
Carl Greene    CSN: [id number] Arrival date & time: 09/03/20  1054      History   Chief Complaint Chief Complaint  Patient presents with  . Otalgia    HPI Carl Greene is a 44 y.o. male  Presenting for left ear pain x3 days.  Denies trauma, travel, bleeding, itching, fever.  No nasal congestion, sore throat.  Has not tried thing for this.  Did have ET tubes placed as a child: None currently.  Past Medical History:  Diagnosis Date  . Chest pain    Echo EF 50-55%. [facility] study 4/11: walked 9:15 no ecg changes. EF 42%?  inferoir attenuation no scar.   Carl Greene Dyspnea   . HTN (hypertension)   . Lightheadedness    with CP    Patient Active Problem List   Diagnosis Date Noted  . Goiter 04/23/2011  . Tobacco use disorder 04/23/2011  . HYPERTENSION, BENIGN ESSENTIAL 03/07/2010  . DYSPNEA 03/07/2010  . CHEST PAIN-UNSPECIFIED 03/07/2010    Past Surgical History:  Procedure Laterality Date  . FRACTURE SURGERY    . MANDIBLE FRACTURE SURGERY         Home Medications    Prior to Admission medications   Medication Sig Start Date End Date Taking? Authorizing Provider  amLODipine (NORVASC) 5 MG tablet Take 1 tablet (5 mg total) by mouth daily. 04/02/20   Isla Pence, MD  aspirin 325 MG EC tablet Take 325 mg by mouth every 6 (six) hours as needed for pain.    [provider]  cephALEXin (KEFLEX) 500 MG capsule Take 1 capsule (500 mg total) by mouth 4 (four) times daily. Patient not taking: Reported on 03/07/2016 05/01/15   Carl Corpus, PA-C  HYDROcodone-acetaminophen (NORCO/VICODIN) 5-325 MG per tablet Take 2 tablets by mouth every 4 (four) hours as needed. Patient not taking: Reported on 02/20/2015 01/18/15   Carl Meadow, PA-C  hydrocortisone (ANUSOL-HC) 25 MG suppository Place 1 suppository (25 mg total) rectally 2 (two) times daily. 01/11/18   Carl Masker B, PA-C  ibuprofen (ADVIL) 600 MG tablet Take 1 tablet (600 mg total) by mouth every 8  (eight) hours as needed for mild pain or moderate pain. 04/02/20   Isla Pence, MD  lidocaine (XYLOCAINE) 2 % jelly Apply 1 application topically as needed. Apply once every 8 hours as needed for discomfort. 01/11/18   Carl Masker B, PA-C  methocarbamol (ROBAXIN) 750 MG tablet Take 1 tablet (750 mg total) by mouth every 6 (six) hours as needed for muscle spasms (and pain). Patient not taking: Reported on 02/20/2015 01/26/15   [name], PA-C  neomycin-polymyxin-hydrocortisone (CORTISPORIN) OTIC solution Place 3 drops into the left ear 3 (three) times daily. 09/03/20   [name], [name], PA-C  potassium chloride 20 MEQ TBCR Take 20 mEq by mouth daily. 04/02/20   Isla Pence, MD    Family History Family History  Problem Relation Age of Onset  . Coronary artery disease Maternal Grandfather   . Hypertension Mother     Social History Social History   Tobacco Use  . Smoking status: Former Smoker    Packs/day: 0.50    Types: Cigarettes    Quit date: 03/12/2013    Years since quitting: 7.4  . Smokeless tobacco: Former Systems developer  . Tobacco comment: quit 4/11.  //RESTARTED. pt quit again 03/11/2013  Substance Use Topics  . Alcohol use: No  . Drug use: Yes    Types: Marijuana    Comment: Uses weekly about  2-3 times.      Allergies   Patient has no known allergies.   Review of Systems As per HPI   Physical Exam Triage Vital Signs ED Triage Vitals  Enc Vitals Group     BP      Pulse      Resp      Temp      Temp src      SpO2      Weight      Height      Head Circumference      Peak Flow      Pain Score      Pain Loc      Pain Edu?      Excl. in [place]?    No data found.  Updated Vital Signs BP (!) 150/94 (BP Location: Right Arm)   Pulse 86   Temp 97.9 F (36.6 C) (Oral)   Resp 18   SpO2 98%   Visual Acuity Right Eye Distance:   Left Eye Distance:   Bilateral Distance:    Right Eye Near:   Left Eye Near:    Bilateral Near:     Physical  Exam Constitutional:      General: He is not in acute distress. HENT:     Head: Normocephalic and atraumatic.     Right Ear: Tympanic membrane, ear canal and external ear normal.     Left Ear: Tympanic membrane normal.     Ears:     Comments: Left ear tragal tenderness with EAC swelling and erythema.  No foreign body, discharge or bleeding. Eyes:     General: No scleral icterus.    Pupils: Pupils are equal, round, and reactive to light.  Cardiovascular:     Rate and Rhythm: Normal rate.  Pulmonary:     Effort: Pulmonary effort is normal. No respiratory distress.     Breath sounds: No wheezing.  Skin:    Coloration: Skin is not jaundiced or pale.  Neurological:     Mental Status: He is alert and oriented to person, place, and time.      UC Treatments / Results  Labs (all labs ordered are listed, but only abnormal results are displayed) Labs Reviewed - No data to display  EKG   Radiology No results found.  Procedures Procedures (including critical Greene time)  Medications Ordered in UC Medications - No data to display  Initial Impression / Assessment and Plan / UC Course  I have reviewed the triage vital signs and the nursing notes.  Pertinent labs & imaging results that were available during my Greene of the patient were reviewed by me and considered in my medical decision making (see chart for details).     Patient febrile, nontoxic in office today.  Will cover for AOE as below.  Provided ENT contact information given patient's history.  Return precautions discussed, pt verbalized understanding and is agreeable to plan. Final Clinical Impressions(s) / UC Diagnoses   Final diagnoses:  Infective otitis externa of left ear     Discharge Instructions     Use eardrops as prescribed for the next week. Return for worsening ear pain, swelling, discharge, bleeding, decreased hearing, development of jaw pain/swelling, fever.  Do NOT use Q-tips as these can cause your  ear wax to get stuck, the tips may break off and become a foreign body requiring additional medical Greene, or puncture your eardrum.  Helpful prevention tip: Use a solution of equal parts isopropyl (rubbing) alcohol  and white vinegar (acetic acid) in both ears after swimming.    ED Prescriptions    Medication Sig Dispense Auth. Provider   neomycin-polymyxin-hydrocortisone (CORTISPORIN) OTIC solution Place 3 drops into the left ear 3 (three) times daily. 10 mL [name], [name], PA-C     I have reviewed the PDMP during this encounter.   Carl Greene, [place] 09/03/20 1204

## 2020-09-03 NOTE — ED Triage Notes (Signed)
Pt here for left ear pain x 3 days

## 2020-11-03 ENCOUNTER — Ambulatory Visit
Admission: EM | Admit: 2020-11-03 | Discharge: 2020-11-03 | Disposition: A | Discharge status: Home / Self Care | Payer: Self-pay | Attending: Emergency Medicine | Admitting: Emergency Medicine

## 2020-11-03 ENCOUNTER — Ambulatory Visit (INDEPENDENT_AMBULATORY_CARE_PROVIDER_SITE_OTHER): Payer: Self-pay

## 2020-11-03 ENCOUNTER — Encounter: Payer: Self-pay | Admitting: *Deleted

## 2020-11-03 ENCOUNTER — Other Ambulatory Visit: Payer: Self-pay

## 2020-11-03 DIAGNOSIS — S8992XA Unspecified injury of left lower leg, initial encounter: Secondary | ICD-10-CM

## 2020-11-03 DIAGNOSIS — M79662 Pain in left lower leg: Secondary | ICD-10-CM

## 2020-11-03 NOTE — ED Triage Notes (Addendum)
Reports got left knee caught between conveyor belt and truck last night at work.  C/O left knee pain and swelling.  LLE CMS intact.  Pt walks with heavy limp.  Long, linear abrasion noted to left lateral and posterior knee, as well as similar abrasion noted to left lateral and posterior proximal lower leg, with area of firm swelling noted to proximal lower leg.

## 2020-11-03 NOTE — Discharge Instructions (Signed)

## 2020-11-03 NOTE — ED Provider Notes (Signed)
[name]    CSN: [id number] Arrival date & time: 11/03/20  1205      History   Chief Complaint Chief Complaint  Patient presents with  . Knee Injury    HPI Carl Greene is a 44 y.o. male  Presenting for left leg crush injury that occurred last night while at work.  Patient denies head trauma, LOC, distal numbness, weakness.  Weightbearing worsens pain.  Has not taken anything for this.  Past Medical History:  Diagnosis Date  . Chest pain    Echo EF 50-55%. Nuke study 4/11: walked 9:15 no ecg changes. EF 42%?  inferoir attenuation no scar.   [name]. Dyspnea   . HTN (hypertension)   . Lightheadedness    with CP    Patient Active Problem List   Diagnosis Date Noted  . Goiter 04/23/2011  . Tobacco use disorder 04/23/2011  . HYPERTENSION, BENIGN ESSENTIAL 03/07/2010  . DYSPNEA 03/07/2010  . CHEST PAIN-UNSPECIFIED 03/07/2010    Past Surgical History:  Procedure Laterality Date  . FRACTURE SURGERY    . MANDIBLE FRACTURE SURGERY         Home Medications    Prior to Admission medications   Medication Sig Start Date End Date Taking? Authorizing Provider  amLODipine (NORVASC) 5 MG tablet Take 1 tablet (5 mg total) by mouth daily. 04/02/20   Jacalyn LefevreHaviland, Julie, MD  aspirin 325 MG EC tablet Take 325 mg by mouth every 6 (six) hours as needed for pain.    [provider]  cephALEXin (KEFLEX) 500 MG capsule Take 1 capsule (500 mg total) by mouth 4 (four) times daily. Patient not taking: No sig reported 05/01/15   Oswaldo Conroyreech, Victoria, PA-C  HYDROcodone-acetaminophen (NORCO/VICODIN) 5-325 MG per tablet Take 2 tablets by mouth every 4 (four) hours as needed. Patient not taking: No sig reported 01/18/15   Elson AreasSofia, Leslie K, PA-C  hydrocortisone (ANUSOL-HC) 25 MG suppository Place 1 suppository (25 mg total) rectally 2 (two) times daily. 01/11/18   Aviva KluverMurray, Alyssa B, PA-C  ibuprofen (ADVIL) 600 MG tablet Take 1 tablet (600 mg total) by mouth every 8 (eight) hours as  needed for mild pain or moderate pain. 04/02/20   Jacalyn LefevreHaviland, Julie, MD  lidocaine (XYLOCAINE) 2 % jelly Apply 1 application topically as needed. Apply once every 8 hours as needed for discomfort. 01/11/18   Aviva KluverMurray, Alyssa B, PA-C  methocarbamol (ROBAXIN) 750 MG tablet Take 1 tablet (750 mg total) by mouth every 6 (six) hours as needed for muscle spasms (and pain). Patient not taking: No sig reported 01/26/15   [place], [name], PA-C  neomycin-polymyxin-hydrocortisone (CORTISPORIN) OTIC solution Place 3 drops into the left ear 3 (three) times daily. 09/03/20   [name], [place], PA-C  potassium chloride 20 MEQ TBCR Take 20 mEq by mouth daily. 04/02/20   Jacalyn LefevreHaviland, Julie, MD    Family History Family History  Problem Relation Age of Onset  . Coronary artery disease Maternal Grandfather   . Hypertension Mother     Social History Social History   Tobacco Use  . Smoking status: Former Smoker    Packs/day: 0.50    Types: Cigarettes    Quit date: 03/12/2013    Years since quitting: 7.6  . Smokeless tobacco: Former NeurosurgeonUser  . Tobacco comment: quit 4/11.  //RESTARTED. pt quit again 03/11/2013  Vaping Use  . Vaping Use: Never used  Substance Use Topics  . Alcohol use: No  . Drug use: Yes    Types: Marijuana  Allergies   Patient has no known allergies.   Review of Systems Review of Systems  Constitutional: Negative for fatigue and fever.  Respiratory: Negative for cough and shortness of breath.   Cardiovascular: Negative for chest pain and palpitations.  Gastrointestinal: Negative for abdominal pain, diarrhea and vomiting.  Musculoskeletal: Positive for gait problem. Negative for arthralgias and myalgias.  Skin: Negative for rash and wound.  Neurological: Negative for speech difficulty and headaches.  All other systems reviewed and are negative.    Physical Exam Triage Vital Signs ED Triage Vitals  Enc Vitals Group     BP 11/03/20 1234 (!) 151/98     Pulse Rate 11/03/20 1234 80      Resp 11/03/20 1234 18     Temp 11/03/20 1234 97.9 F (36.6 C)     Temp Source 11/03/20 1234 Oral     SpO2 11/03/20 1234 98 %     Weight --      Height --      Head Circumference --      Peak Flow --      Pain Score 11/03/20 1236 8     Pain Loc --      Pain Edu? --      Excl. in [place]? --    No data found.  Updated Vital Signs BP (!) 151/98   Pulse 80   Temp 97.9 F (36.6 C) (Oral)   Resp 18   SpO2 98%   Visual Acuity Right Eye Distance:   Left Eye Distance:   Bilateral Distance:    Right Eye Near:   Left Eye Near:    Bilateral Near:     Physical Exam Constitutional:      General: He is not in acute distress. HENT:     Head: Normocephalic and atraumatic.  Eyes:     General: No scleral icterus.    Pupils: Pupils are equal, round, and reactive to light.  Cardiovascular:     Rate and Rhythm: Normal rate.  Pulmonary:     Effort: Pulmonary effort is normal. No respiratory distress.     Breath sounds: No wheezing.  Musculoskeletal:     Comments: Knee is unremarkable bilaterally.  Full active ROM of left knee without crepitus, patellar tenderness or deformity.  Knee joint stable to valgus and varus stress.  Negative anterior drawer test.  Patient does have posterolateral, proximal lower leg tenderness with swelling.  Slight bruising.  Neurovascularly intact.  Skin:    Coloration: Skin is not jaundiced or pale.  Neurological:     Mental Status: He is alert and oriented to person, place, and time.      UC Treatments / Results  Labs (all labs ordered are listed, but only abnormal results are displayed) Labs Reviewed - No data to display  EKG   Radiology DG Tibia/Fibula Left  Result Date: 11/03/2020 CLINICAL DATA:  Left leg pain following blunt trauma, initial encounter EXAM: LEFT TIBIA AND FIBULA - 2 VIEW COMPARISON:  01/19/2015 FINDINGS: There is no evidence of fracture or other focal bone lesions. Soft tissues are unremarkable. IMPRESSION: No acute abnormality  noted Electronically Signed   By: Inez Catalina M.D.   On: 11/03/2020 13:24    Procedures Procedures (including critical care time)  Medications Ordered in UC Medications - No data to display  Initial Impression / Assessment and Plan / UC Course  I have reviewed the triage vital signs and the nursing notes.  Pertinent labs & imaging results that were  available during my care of the patient were reviewed by me and considered in my medical decision making (see chart for details).     Tib-fib x-ray negative.  Given Ace wrap, crutches in office.  Reviewed supportive care as below.  Return precautions discussed, pt verbalized understanding and is agreeable to plan. Final Clinical Impressions(s) / UC Diagnoses   Final diagnoses:  Injury of left lower extremity, initial encounter     Discharge Instructions     RICE: rest, ice, compression, elevation as needed for pain.    Pain medication:  350 mg-1000 mg of Tylenol (acetaminophen) and/or 200 mg - 800 mg of Advil (ibuprofen, Motrin) every 8 hours as needed.  May alternate between the two throughout the day as they are generally safe to take together.  DO NOT exceed more than 3000 mg of Tylenol or 3200 mg of ibuprofen in a 24 hour period as this could damage your stomach, kidneys, liver, or increase your bleeding risk.  Important to follow up with specialist(s) below for further evaluation/management if your symptoms persist or worsen.    ED Prescriptions    None     PDMP not reviewed this encounter.   [name], [name], [place] 11/03/20 1339

## 2020-11-07 ENCOUNTER — Other Ambulatory Visit: Payer: Self-pay

## 2020-11-07 ENCOUNTER — Emergency Department (HOSPITAL_BASED_OUTPATIENT_CLINIC_OR_DEPARTMENT_OTHER)
Admission: EM | Admit: 2020-11-07 | Discharge: 2020-11-07 | Disposition: A | Discharge status: Home / Self Care | Payer: Self-pay | Attending: Emergency Medicine | Admitting: Emergency Medicine

## 2020-11-07 ENCOUNTER — Encounter (HOSPITAL_BASED_OUTPATIENT_CLINIC_OR_DEPARTMENT_OTHER): Payer: Self-pay

## 2020-11-07 ENCOUNTER — Emergency Department (HOSPITAL_BASED_OUTPATIENT_CLINIC_OR_DEPARTMENT_OTHER): Payer: Self-pay

## 2020-11-07 DIAGNOSIS — Y990 Civilian activity done for income or pay: Secondary | ICD-10-CM | POA: Insufficient documentation

## 2020-11-07 DIAGNOSIS — I10 Essential (primary) hypertension: Secondary | ICD-10-CM | POA: Insufficient documentation

## 2020-11-07 DIAGNOSIS — M25562 Pain in left knee: Secondary | ICD-10-CM

## 2020-11-07 DIAGNOSIS — M25512 Pain in left shoulder: Secondary | ICD-10-CM | POA: Insufficient documentation

## 2020-11-07 DIAGNOSIS — S80212A Abrasion, left knee, initial encounter: Secondary | ICD-10-CM | POA: Insufficient documentation

## 2020-11-07 DIAGNOSIS — Z87891 Personal history of nicotine dependence: Secondary | ICD-10-CM | POA: Insufficient documentation

## 2020-11-07 DIAGNOSIS — G8929 Other chronic pain: Secondary | ICD-10-CM | POA: Insufficient documentation

## 2020-11-07 DIAGNOSIS — R030 Elevated blood-pressure reading, without diagnosis of hypertension: Secondary | ICD-10-CM

## 2020-11-07 DIAGNOSIS — W228XXA Striking against or struck by other objects, initial encounter: Secondary | ICD-10-CM | POA: Insufficient documentation

## 2020-11-07 DIAGNOSIS — Z7982 Long term (current) use of aspirin: Secondary | ICD-10-CM | POA: Insufficient documentation

## 2020-11-07 MED ORDER — AMLODIPINE BESYLATE 10 MG PO TABS: 10.0000 mg | ORAL_TABLET | Freq: Every day | ORAL | 0 refills | Status: DC

## 2020-11-07 NOTE — ED Triage Notes (Addendum)
Pt reports knee injury at work on 12/23 that he was seen for on the same day. Pt reports swelling and pain in L knee. Pt states the swelling has went down and an xray was done on day that the injury happened. Pt ambulatory on crutches. Pt states he is going to the orthopedic tomorrow. Pt also reports L shoulder pain that radiates into L side of neck from injury in July.

## 2020-11-07 NOTE — Discharge Instructions (Addendum)
At this time there does not appear to be the presence of an emergent medical condition, however there is always the potential for conditions to change. Please read and follow the below instructions.  Please return to the Emergency Department immediately for any new or worsening symptoms. Please be sure to follow up with your Primary Care Provider within one week regarding your visit today; please call their office to schedule an appointment even if you are feeling better for a follow-up visit. Please take your blood pressure medication amlodipine as prescribed to treat your hypertension.  Please go to your primary care doctor appointment this week for blood pressure recheck and medication management. Please go to your orthopedist appointment tomorrow as scheduled for reevaluation of your left knee pain and left shoulder pain.  Use rest ice elevation to help with pain.  Go to the nearest Emergency Department immediately if: You have fever or chills Get a very bad headache. Start to feel mixed up (confused). Feel weak or numb. Feel faint. Have very bad pain in your: Chest. Belly (abdomen). Throw up more than once. Have trouble breathing. Your knee swells, and the swelling gets worse. You cannot move your knee. You have very bad knee pain. Your arm, hand, or fingers: Tingle. Are numb. Are swollen. Are painful. Turn white or blue. You have any new/concerning or worsening of symptoms.   Please read the additional information packets attached to your discharge summary.  Do not take your medicine if  develop an itchy rash, swelling in your mouth or lips, or difficulty breathing; call 911 and seek immediate emergency medical attention if this occurs.  You may review your lab tests and imaging results in their entirety on your MyChart account.  Please discuss all results of fully with your primary care provider and other specialist at your follow-up visit.  Note: Portions of this text may  have been transcribed using voice recognition software. Every effort was made to ensure accuracy; however, inadvertent computerized transcription errors may still be present.

## 2020-11-07 NOTE — ED Provider Notes (Signed)
[facility] Provider Note   CSN: [id number] Arrival date & time: 11/07/20  1017     History Chief Complaint  Patient presents with  . Knee Pain  . Shoulder Pain    Carl Greene is a 44 y.o. male presents today for left knee pain and left shoulder pain.  Patient reports 4 days ago he was at work when his leg got caught in a conveyor belt and pushed him up against another piece of equipment.  He reports immediate left knee pain aching constant nonradiating worsened with movement palpation no alleviating factors, pain currently moderate in intensity.  He reports he had x-ray performed at urgent care which was negative, he reports he was given crutches and referred to orthopedist.  He reports he is to see the orthopedic specialist tomorrow morning but reports pain was too severe to wait that long so he wanted to come in for reevaluation.  Patient second concern is left shoulder pain that has been going on for several months.  He denies any certain injury to the area reports a mild aching pain of his left shoulder only occurs with overhead movements and improves with rest, nonradiating.  Denies head injury, loss consciousness, blood thinner use, neck pain, back pain, chest pain, abdominal pain, nausea/vomiting, numbness/tingling, weakness or any additional concerns  HPI     Past Medical History:  Diagnosis Date  . Chest pain    Echo EF 50-55%. Nuke study 4/11: walked 9:15 no ecg changes. EF 42%?  inferoir attenuation no scar.   [name] Dyspnea   . HTN (hypertension)   . Lightheadedness    with CP    Patient Active Problem List   Diagnosis Date Noted  . Goiter 04/23/2011  . Tobacco use disorder 04/23/2011  . HYPERTENSION, BENIGN ESSENTIAL 03/07/2010  . DYSPNEA 03/07/2010  . CHEST PAIN-UNSPECIFIED 03/07/2010    Past Surgical History:  Procedure Laterality Date  . FRACTURE SURGERY    . MANDIBLE FRACTURE SURGERY         Family History   Problem Relation Age of Onset  . Coronary artery disease Maternal Grandfather   . Hypertension Mother     Social History   Tobacco Use  . Smoking status: Former Smoker    Packs/day: 0.50    Types: Cigarettes    Quit date: 03/12/2013    Years since quitting: 7.6  . Smokeless tobacco: Former Systems developer  . Tobacco comment: quit 4/11.  //RESTARTED. pt quit again 03/11/2013  Vaping Use  . Vaping Use: Never used  Substance Use Topics  . Alcohol use: No  . Drug use: Yes    Types: Marijuana    Home Medications Prior to Admission medications   Medication Sig Start Date End Date Taking? Authorizing Provider  amLODipine (NORVASC) 10 MG tablet Take 1 tablet (10 mg total) by mouth daily. 11/07/20   [name], PA-C  aspirin 325 MG EC tablet Take 325 mg by mouth every 6 (six) hours as needed for pain.    [provider]  cephALEXin (KEFLEX) 500 MG capsule Take 1 capsule (500 mg total) by mouth 4 (four) times daily. Patient not taking: No sig reported 05/01/15   [name], PA-C  HYDROcodone-acetaminophen (NORCO/VICODIN) 5-325 MG per tablet Take 2 tablets by mouth every 4 (four) hours as needed. Patient not taking: No sig reported 01/18/15   [name], PA-C  hydrocortisone (ANUSOL-HC) 25 MG suppository Place 1 suppository (25 mg total) rectally 2 (two) times  daily. 01/11/18   [name], PA-C  ibuprofen (ADVIL) 600 MG tablet Take 1 tablet (600 mg total) by mouth every 8 (eight) hours as needed for mild pain or moderate pain. 04/02/20   Isla Pence, MD  lidocaine (XYLOCAINE) 2 % jelly Apply 1 application topically as needed. Apply once every 8 hours as needed for discomfort. 01/11/18   [name], PA-C  methocarbamol (ROBAXIN) 750 MG tablet Take 1 tablet (750 mg total) by mouth every 6 (six) hours as needed for muscle spasms (and pain). Patient not taking: No sig reported 01/26/15   [name], [name], PA-C  neomycin-polymyxin-hydrocortisone (CORTISPORIN) OTIC solution  Place 3 drops into the left ear 3 (three) times daily. 09/03/20   [name], [name], PA-C  potassium chloride 20 MEQ TBCR Take 20 mEq by mouth daily. 04/02/20   Isla Pence, MD    Allergies    Patient has no known allergies.  Review of Systems   Review of Systems Ten systems are reviewed and are negative for acute change except as noted in the HPI  Physical Exam Updated Vital Signs BP (!) 163/115 (BP Location: Right Arm)   Pulse 71   Temp 98.1 F (36.7 C) (Oral)   Resp 16   Ht 5\' 8"  (1.727 m)   Wt 74.8 kg   SpO2 100%   BMI 25.09 kg/m   Physical Exam Constitutional:      General: He is not in acute distress.    Appearance: Normal appearance. He is well-developed. He is not ill-appearing or diaphoretic.  HENT:     Head: Normocephalic and atraumatic.  Eyes:     General: Vision grossly intact. Gaze aligned appropriately.     Pupils: Pupils are equal, round, and reactive to light.  Neck:     Trachea: Trachea and phonation normal.  Cardiovascular:     Rate and Rhythm: Normal rate and regular rhythm.     Pulses:          Dorsalis pedis pulses are 2+ on the right side and 2+ on the left side.  Pulmonary:     Effort: Pulmonary effort is normal. No respiratory distress.  Abdominal:     General: There is no distension.     Palpations: Abdomen is soft.     Tenderness: There is no abdominal tenderness. There is no guarding or rebound.  Musculoskeletal:        General: Normal range of motion.     Cervical back: Normal range of motion.     Comments: Cervical Spine: Appearance normal. No obvious bony deformity. No skin swelling, erythema, heat, fluctuance or break of the skin. No TTP over the cervical spinous processes. No paraspinal tenderness. No step-offs. Patient is able to actively rotate their neck 45 degrees left and right voluntarily without pain and flex and extend the neck without pain.  - Left Shoulder: Appearance normal. No obvious bony deformity. No skin swelling,  erythema, heat, fluctuance or break of the skin. No clavicular deformity or TTP. Active and passive flexion, extension, abduction, adduction, and internal/external rotation. Mild pain with overhead motion but no limitation in range. Strength for flexion, extension, abduction, adduction, and internal/external rotation intact and appropriate for age.  - Left Elbow: Appearance normal. No obvious bony deformity. No skin swelling, erythema, heat, fluctuance or break of the skin. No TTP over joint. Active flexion, extension, supination and pronation full and intact without pain. Strength able and appropriate for age for flexion and extension.  Radial Pulse 2+. Cap  refill <2 seconds. SILT for M/U/R distributions. Compartments soft.  ---------- Left Knee: 2 small linear abrasions behind the left knee and left lower leg. No obvious deformity. No skin swelling, erythema, heat, fluctuance. Lateral joint line ttp.  Full range of motion of the left knee is some increased pain with flexion at end motion. Negative anterior/poster drawer bilaterally. Negative ballottement test. No varus or valgus laxity or locking. No tenderness to palpation of hips or ankles.Compartments soft. Neurovascularly intact distally to site of injury.  Feet:     Right foot:     Protective Sensation: 5 sites tested. 5 sites sensed.     Left foot:     Protective Sensation: 5 sites tested. 5 sites sensed.  Skin:    General: Skin is warm and dry.          Comments: 2 small linear abrasions to the posterior left knee and lower leg without evidence of infection.  Neurological:     Mental Status: He is alert.     GCS: GCS eye subscore is 4. GCS verbal subscore is 5. GCS motor subscore is 6.     Comments: Speech is clear and goal oriented, follows commands Major Cranial nerves without deficit, no facial droop Moves extremities without ataxia, coordination intact  Psychiatric:        Behavior: Behavior normal.     ED Results /  Procedures / Treatments   Labs (all labs ordered are listed, but only abnormal results are displayed) Labs Reviewed - No data to display  EKG None  Radiology DG Shoulder Left  Result Date: 11/07/2020 CLINICAL DATA:  Pain following fall EXAM: LEFT SHOULDER - 2+ VIEW COMPARISON:  None. FINDINGS: Oblique, Y scapular, and axillary images were obtained. No fracture or dislocation. Joint spaces appear normal. No erosive change or intra-articular calcification. Visualized left lung clear. IMPRESSION: No fracture or dislocation.  No evident arthropathy. Electronically Signed   By: Lowella Grip III M.D.   On: 11/07/2020 14:33   DG Knee Complete 4 Views Left  Result Date: 11/07/2020 CLINICAL DATA:  Pain following fall EXAM: LEFT KNEE - COMPLETE 4+ VIEW COMPARISON:  None. FINDINGS: Frontal, lateral, and bilateral oblique views were obtained. No fracture, dislocation, or joint effusion. Joint spaces appear normal. No erosive change. Small bone island in the proximal tibia. IMPRESSION: No fracture, dislocation, or joint effusion. No evident arthropathy. Electronically Signed   By: Lowella Grip III M.D.   On: 11/07/2020 14:33    Procedures Procedures (including critical care time)  Medications Ordered in ED Medications - No data to display  ED Course  I have reviewed the triage vital signs and the nursing notes.  Pertinent labs & imaging results that were available during my care of the patient were reviewed by me and considered in my medical decision making (see chart for details).    MDM Rules/Calculators/A&P                         Additional history obtained from: 1. Nursing notes from this visit. 2. Review of electronic medical records.  Patient seen at urgent care on 11/03/2020 diagnosis injury of the left lower extremity.  He was referred to [facility] orthopedics.  DG left tib-fib was negative for acute findings - DG Left Shoulder:   IMPRESSION:  No fracture or  dislocation. No evident arthropathy.   DG Left Knee:  IMPRESSION:  No fracture or dislocation. No evident arthropathy.  - As patient's  left knee injury he reports he is up-to-date on his Tdap.  He has no overlying skin changes to suggest cellulitis no evidence of septic arthritis, DVT, compartment syndrome, fracture/dislocation or neurovascular compromise.  Patient placed in knee immobilizer, given crutches encouraged to go to the orthopedic appointment he has tomorrow for further evaluation.  Patient's chronic left shoulder pain x-ray is also negative.  He has only pain with movements above shoulder level possible rotator cuff pathology versus other soft tissue injury.  No evidence of septic arthritis, cellulitis, DVT, compartment syndrome, neurovascular compromise, fracture/dislocation or other emergent pathologies.  Patient reassessed he is using his crutches to walk around the ED without evidence of pain, patient encouraged to discuss his shoulder pain with the orthopedist tomorrow as well.  He has no chest pain or difficulty breathing or other symptoms to suggest cardiopulmonary etiology of his left shoulder pain is reproducible on exam and only present with movements above shoulder level.  Incidentally patient noted to be hypertensive in the emergency department he is asymptomatic but is high blood pressure reading today.  Patient encouraged to have blood pressure rechecked by his PCP this week and discuss medication management with them at that time.  Patient advised of signs/symptoms of hypertensive urgency/emergency and to return to the ER if they occur.  Patient reports he is out of his amlodipine prescription will start patient amlodipine 10 mg p.o. daily and encouraged him to go see his PCP this week for recheck.  At this time there does not appear to be any evidence of an acute emergency medical condition and the patient appears stable for discharge with appropriate outpatient follow up.  Diagnosis was discussed with patient who verbalizes understanding of care plan and is agreeable to discharge. I have discussed return precautions with patient who verbalizes understanding. Patient encouraged to follow-up with their PCP and ortho. All questions answered.  Patient's case discussed with Dr. Langston Masker who agrees with plan to discharge with 10mg  Amlodipine and outpatient follow-up.   Note: Portions of this report may have been transcribed using voice recognition software. Every effort was made to ensure accuracy; however, inadvertent computerized transcription errors may still be present. Final Clinical Impression(s) / ED Diagnoses Final diagnoses:  Acute pain of left knee  Chronic left shoulder pain  Elevated blood pressure reading    Rx / DC Orders ED Discharge Orders         Ordered    amLODipine (NORVASC) 10 MG tablet  Daily        11/07/20 1555           Gari Crown 11/07/20 1558    Wyvonnia Dusky, MD 11/08/20 [phone]

## 2021-10-03 ENCOUNTER — Other Ambulatory Visit: Payer: Self-pay

## 2021-10-03 ENCOUNTER — Ambulatory Visit
Admission: EM | Admit: 2021-10-03 | Discharge: 2021-10-03 | Disposition: A | Discharge status: Home / Self Care | Payer: Self-pay | Attending: Physician Assistant | Admitting: Physician Assistant

## 2021-10-03 DIAGNOSIS — R220 Localized swelling, mass and lump, head: Secondary | ICD-10-CM

## 2021-10-03 MED ORDER — DOXYCYCLINE HYCLATE 100 MG PO CAPS
100.0000 mg | ORAL_CAPSULE | Freq: Two times a day (BID) | ORAL | 0 refills | Status: DC
Start: 2021-10-03 — End: 2021-12-14

## 2021-10-03 MED ORDER — PREDNISONE 20 MG PO TABS
40.0000 mg | ORAL_TABLET | Freq: Every day | ORAL | 0 refills | Status: AC
Start: 2021-10-03 — End: 2021-10-08

## 2021-10-03 NOTE — ED Triage Notes (Signed)
Pt c/o swollen bottom lip from a fever blister since Friday. States using Abreva with some relief.

## 2021-10-03 NOTE — ED Provider Notes (Signed)
[facility]    CSN: [id number] Arrival date & time: 10/03/21  1323      History   Chief Complaint Chief Complaint  Patient presents with   Oral Swelling    HPI Carl MAZZIE is a 45 y.o. male.   Patient here today for evaluation of swelling to his lower lip that started 4 days ago after he noticed a fever blister to his lip.  He does state that he was picking at skin at his lip as well.  He is not sure if this might of caused further swelling and irritation.  He has not had fever.  He denies any issues with swallowing or shortness of breath.  Patient is noted to have elevated blood pressure in office, and states he does not currently have a PCP.  The history is provided by the patient.   Past Medical History:  Diagnosis Date   Chest pain    Echo EF 50-55%. [facility] study 4/11: walked 9:15 no ecg changes. EF 42%?  inferoir attenuation no scar.    Dyspnea    HTN (hypertension)    Lightheadedness    with CP    Patient Active Problem List   Diagnosis Date Noted   Goiter 04/23/2011   Tobacco use disorder 04/23/2011   HYPERTENSION, BENIGN ESSENTIAL 03/07/2010   DYSPNEA 03/07/2010   CHEST PAIN-UNSPECIFIED 03/07/2010    Past Surgical History:  Procedure Laterality Date   FRACTURE SURGERY     MANDIBLE FRACTURE SURGERY         Home Medications    Prior to Admission medications   Medication Sig Start Date End Date Taking? Authorizing Provider  doxycycline (VIBRAMYCIN) 100 MG capsule Take 1 capsule (100 mg total) by mouth 2 (two) times daily. 10/03/21  Yes Francene Finders, PA-C  predniSONE (DELTASONE) 20 MG tablet Take 2 tablets (40 mg total) by mouth daily with breakfast for 5 days. 10/03/21 10/08/21 Yes Francene Finders, PA-C  amLODipine (NORVASC) 10 MG tablet Take 1 tablet (10 mg total) by mouth daily. 11/07/20   [name], PA-C  aspirin 325 MG EC tablet Take 325 mg by mouth every 6 (six) hours as needed for pain.    [provider]   cephALEXin (KEFLEX) 500 MG capsule Take 1 capsule (500 mg total) by mouth 4 (four) times daily. Patient not taking: No sig reported 05/01/15   [name], PA-C  HYDROcodone-acetaminophen (NORCO/VICODIN) 5-325 MG per tablet Take 2 tablets by mouth every 4 (four) hours as needed. Patient not taking: No sig reported 01/18/15   [name], PA-C  hydrocortisone (ANUSOL-HC) 25 MG suppository Place 1 suppository (25 mg total) rectally 2 (two) times daily. 01/11/18   [name], PA-C  ibuprofen (ADVIL) 600 MG tablet Take 1 tablet (600 mg total) by mouth every 8 (eight) hours as needed for mild pain or moderate pain. 04/02/20   Isla Pence, MD  lidocaine (XYLOCAINE) 2 % jelly Apply 1 application topically as needed. Apply once every 8 hours as needed for discomfort. 01/11/18   [name], PA-C  methocarbamol (ROBAXIN) 750 MG tablet Take 1 tablet (750 mg total) by mouth every 6 (six) hours as needed for muscle spasms (and pain). Patient not taking: No sig reported 01/26/15   [name], [name], PA-C  neomycin-polymyxin-hydrocortisone (CORTISPORIN) OTIC solution Place 3 drops into the left ear 3 (three) times daily. 09/03/20   [name], [name], PA-C  potassium chloride 20 MEQ TBCR Take 20 mEq by mouth  daily. 04/02/20   Isla Pence, MD    Family History Family History  Problem Relation Age of Onset   Coronary artery disease Maternal Grandfather    Hypertension Mother     Social History Social History   Tobacco Use   Smoking status: Former    Packs/day: 0.50    Types: Cigarettes    Quit date: 03/12/2013    Years since quitting: 8.5   Smokeless tobacco: Former   Tobacco comments:    quit 4/11.  [name]. pt quit again 03/11/2013  Vaping Use   Vaping Use: Never used  Substance Use Topics   Alcohol use: No   Drug use: Yes    Types: Marijuana     Allergies   Patient has no known allergies.   Review of Systems Review of Systems  Constitutional:  Negative for chills  and fever.  HENT:  Positive for facial swelling and mouth sores. Negative for drooling and trouble swallowing.   Eyes:  Negative for discharge and redness.  Respiratory:  Negative for shortness of breath and wheezing.   Gastrointestinal:  Negative for diarrhea, nausea and vomiting.  Neurological:  Negative for numbness.    Physical Exam Triage Vital Signs ED Triage Vitals  Enc Vitals Group     BP      Pulse      Resp      Temp      Temp src      SpO2      Weight      Height      Head Circumference      Peak Flow      Pain Score      Pain Loc      Pain Edu?      Excl. in [place]?    No data found.  Updated Vital Signs BP (!) 187/104 (BP Location: Left Arm)   Pulse 70   Temp 98.4 F (36.9 C) (Oral)   Resp 18   SpO2 97%   Physical Exam Vitals and nursing note reviewed.  Constitutional:      General: He is not in acute distress.    Appearance: Normal appearance. He is not ill-appearing.  HENT:     Head: Normocephalic and atraumatic.     Comments: Cold sore noted to mid lower lip.  Swelling noted diffusely to lower lip with left side worse than right. Eyes:     Conjunctiva/sclera: Conjunctivae normal.  Cardiovascular:     Rate and Rhythm: Normal rate.  Pulmonary:     Effort: Pulmonary effort is normal.  Neurological:     Mental Status: He is alert.  Psychiatric:        Mood and Affect: Mood normal.        Behavior: Behavior normal.        Thought Content: Thought content normal.     UC Treatments / Results  Labs (all labs ordered are listed, but only abnormal results are displayed) Labs Reviewed - No data to display  EKG   Radiology No results found.  Procedures Procedures (including critical [facility] time)  Medications Ordered in UC Medications - No data to display  Initial Impression / Assessment and Plan / UC Course  I have reviewed the triage vital signs and the nursing notes.  Pertinent labs & imaging results that were available during my [facility] of  the patient were reviewed by me and considered in my medical decision making (see chart for details).  Antibiotic therapy as well  as steroid prescribed for swelling of lip.  Recommended he follow-up with PCP as soon as possible for further evaluation and treatment of hypertension.  Patient shows no signs or symptoms today of hypertensive crisis.  Encouraged follow-up with any further concerns.  Final Clinical Impressions(s) / UC Diagnoses   Final diagnoses:  Lip swelling   Discharge Instructions   None    ED Prescriptions     Medication Sig Dispense Auth. Provider   predniSONE (DELTASONE) 20 MG tablet Take 2 tablets (40 mg total) by mouth daily with breakfast for 5 days. 10 tablet Ewell Poe F, PA-C   doxycycline (VIBRAMYCIN) 100 MG capsule Take 1 capsule (100 mg total) by mouth 2 (two) times daily. 20 capsule Francene Finders, PA-C      PDMP not reviewed this encounter.   Francene Finders, PA-C 10/03/21 [id number]

## 2021-11-27 ENCOUNTER — Ambulatory Visit: Payer: Self-pay | Admitting: Family Medicine

## 2021-12-08 NOTE — Progress Notes (Addendum)
Subjective:    Carl [facility] - 46 y.o. male MRN [id number]  Date of birth: 1976-06-12  HPI  Carl [facility] is to establish care.   Current issues and/or concerns: HYPERTENSION: Currently taking: see medication list Have you taken your blood pressure medication today: []  Yes []  No  Med Adherence: []  Yes    [x]  No Adherence with salt restriction (low-salt diet): [x]  Yes  []  No Exercise: Yes []  No [x]  Home Monitoring?: []  Yes    [x]  No but has the means to do so on smart watch.  Monitoring Frequency: []  Yes    [x]  No Home BP results range: []  Yes    [x]  No Smoking [x]  Yes, marijuana SOB? []  Yes    [x]  No Chest Pain?: []  Yes    [x]  No Headaches?: [x]  Yes, since childhood but recently worsening, taking Bayer Aspirin Comments: Reports at least 7 years without blood pressure medication related to not having a primary provider after disconnected with previous. Taking Amlodipine at that time. No history of taking any other blood pressure medications.   2. ACID REFLUX: Caused by spicy foods and red sauces. Endorses difficulty swallowing sometimes. Recalls eating a piece of bread and getting stuck in throat had to pull off the road for safety. Denies coughing up any blood or blood in stools.   3. EYE EXAM: Needs eye exam because of blurry vision.   ROS per HPI     Health Maintenance:  Health Maintenance Due  Topic Date Due   COVID-19 Vaccine (1) Never done   Hepatitis C Screening  Never done   COLONOSCOPY (Pts 45-50yrs Insurance coverage will need to be confirmed)  Never done   INFLUENZA VACCINE  Never done     Past Medical History: Patient Active Problem List   Diagnosis Date Noted   Goiter 04/23/2011   Tobacco use disorder 04/23/2011   HYPERTENSION, BENIGN ESSENTIAL 03/07/2010   DYSPNEA 03/07/2010   CHEST PAIN-UNSPECIFIED 03/07/2010     Social History   reports that he quit smoking about 8 years ago. His smoking use included cigarettes. He smoked an average of  .5 packs per day. He has quit using smokeless tobacco. He reports current drug use. Drug: Marijuana. He reports that he does not drink alcohol.   Family History  family history includes Coronary artery disease in his maternal grandfather; Hypertension in his mother.   Medications: reviewed and updated   Objective:   Physical Exam BP (!) 167/101 (BP Location: Left Arm, Patient Position: Sitting, Cuff Size: Large)    Pulse 77    Temp 98.3 F (36.8 C)    Resp 18    Ht 5' 7.72" (1.72 m)    Wt 157 lb 3.2 oz (71.3 kg)    SpO2 98%    BMI 24.10 kg/m   Physical Exam HENT:     Head: Normocephalic and atraumatic.  Eyes:     Extraocular Movements: Extraocular movements intact.     Conjunctiva/sclera: Conjunctivae normal.     Pupils: Pupils are equal, round, and reactive to light.  Cardiovascular:     Rate and Rhythm: Normal rate and regular rhythm.     Pulses: Normal pulses.     Heart sounds: Normal heart sounds.  Pulmonary:     Effort: Pulmonary effort is normal.     Breath sounds: Normal breath sounds.  Musculoskeletal:     Cervical back: Normal range of motion and neck supple.  Neurological:     General: No  focal deficit present.     Mental Status: He is alert and oriented to person, place, and time.  Psychiatric:        Mood and Affect: Mood normal.        Behavior: Behavior normal.      Assessment & Plan:  1. Encounter to establish care: - Patient presents today to establish care.  - Return for annual physical examination, labs, and health maintenance. Arrive fasting meaning having no food for at least 8 hours prior to appointment. You may have only water or black coffee. Please take scheduled medications as normal.  2. Essential (primary) hypertension: - Blood pressure not at goal during today's visit. Patient asymptomatic without chest pressure, chest pain, palpitations, shortness of breath, worst headache of life, and any additional red flag symptoms. Suspect intermittent  headaches are secondary to uncontrolled hypertension. - Resume Amlodipine as prescribed.  - Begin Valsartan as prescribed. Counseled on medication compliance and adverse effects.  - Counseled on blood pressure goal of less than 130/80, low-sodium, DASH diet, medication compliance, 150 minutes of moderate intensity exercise per week as tolerated. Discussed medication compliance, adverse effects. - BMP to evaluate kidney function and electrolyte balance. - Follow-up in 2 weeks with primary provider for blood pressure check. Write down your blood pressure readings each day and bring those results along with your home blood pressure monitor to your appointment.  - valsartan (DIOVAN) 40 MG tablet; Take 1 tablet (40 mg total) by mouth daily.  Dispense: 30 tablet; Refill: 0 - Basic Metabolic Panel - amLODipine (NORVASC) 10 MG tablet; Take 1 tablet (10 mg total) by mouth daily.  Dispense: 90 tablet; Refill: 0  3. Gastroesophageal reflux disease, unspecified whether esophagitis present: 4. Dysphagia, unspecified type: - Omeprazole as prescribed.  - You may feel better if you: ?Avoid foods that make your symptoms worse - For some people these include coffee, chocolate, alcohol, peppermint, and fatty foods. ?Stop smoking, if you smoke ?Avoid late meals - Lying down with a full stomach can make reflux worse. Try to plan meals for at least 2 to 3 hours before bedtime. - Referral to Gastroenterology for further evaluation and management. - omeprazole (PRILOSEC) 20 MG capsule; Take 1 capsule (20 mg total) by mouth daily.  Dispense: 120 capsule; Refill: 0 - Ambulatory referral to Gastroenterology  5. Routine eye exam: - Referral to Ophthalmology for further evaluation and management.  - Ambulatory referral to Ophthalmology   Patient was given clear instructions to go to Emergency Department or return to medical center if symptoms don't improve, worsen, or new problems develop.The patient verbalized  understanding.  I discussed the assessment and treatment plan with the patient. The patient was provided an opportunity to ask questions and all were answered. The patient agreed with the plan and demonstrated an understanding of the instructions.   The patient was advised to call back or seek an in-person evaluation if the symptoms worsen or if the condition fails to improve as anticipated.    [name], NP 12/14/2021, 11:49 AM [facility]

## 2021-12-14 ENCOUNTER — Ambulatory Visit (INDEPENDENT_AMBULATORY_CARE_PROVIDER_SITE_OTHER): Payer: 59 | Admitting: Family

## 2021-12-14 ENCOUNTER — Other Ambulatory Visit: Payer: Self-pay

## 2021-12-14 ENCOUNTER — Encounter: Payer: Self-pay | Admitting: Family

## 2021-12-14 VITALS — BP 167/101 | HR 77 | Temp 98.3°F | Resp 18 | Ht 67.72 in | Wt 157.2 lb

## 2021-12-14 DIAGNOSIS — K219 Gastro-esophageal reflux disease without esophagitis: Secondary | ICD-10-CM | POA: Diagnosis not present

## 2021-12-14 DIAGNOSIS — I10 Essential (primary) hypertension: Secondary | ICD-10-CM

## 2021-12-14 DIAGNOSIS — Z7689 Persons encountering health services in other specified circumstances: Secondary | ICD-10-CM | POA: Diagnosis not present

## 2021-12-14 DIAGNOSIS — R1310 Dysphagia, unspecified: Secondary | ICD-10-CM

## 2021-12-14 DIAGNOSIS — Z0100 Encounter for examination of eyes and vision without abnormal findings: Secondary | ICD-10-CM

## 2021-12-14 MED ORDER — AMLODIPINE BESYLATE 10 MG PO TABS: 10.0000 mg | ORAL_TABLET | Freq: Every day | ORAL | 0 refills | Status: DC

## 2021-12-14 MED ORDER — VALSARTAN 40 MG PO TABS: 40.0000 mg | ORAL_TABLET | Freq: Every day | ORAL | 0 refills | Status: DC

## 2021-12-14 MED ORDER — OMEPRAZOLE 20 MG PO CPDR: 20.0000 mg | DELAYED_RELEASE_CAPSULE | Freq: Every day | ORAL | 0 refills | Status: DC

## 2021-12-14 NOTE — Patient Instructions (Addendum)
Return in 2 weeks for blood pressure check and annual physical exam.   Thank you for choosing [facility] at [facility] for your medical home!    Carl [facility] was seen by Camillia Herter, NP today.   Carl [facility]'s primary care provider is Camillia Herter, NP.   For the best care possible,  you should try to see [name], NP whenever you come to clinic.   We look forward to seeing you again soon!  If you have any questions about your visit today,  please call us at [phone]  Or feel free to reach your provider via [place].    Valsartan Tablets What is this medication? VALSARTAN (val SAR tan) treats high blood pressure and heart failure. It may also be used to prevent further damage after a heart attack. It works by relaxing the blood vessels, which decreases blood pressure and the amount of work the heart has to do. It belongs to a group of medications called ARBs. This medicine may be used for other purposes; ask your health care provider or pharmacist if you have questions. COMMON BRAND NAME(S): Diovan What should I tell my care team before I take this medication? They need to know if you have any of these conditions: Heart failure Kidney disease Liver disease An unusual or allergic reaction to valsartan, other medications, foods, dyes, or preservatives Pregnant or trying to get pregnant Breast-feeding How should I use this medication? Take this medication by mouth. Take it as directed on the prescription label at the same time every day. You can take it with or without food. If it upsets your stomach, take it with food. Keep taking it unless your care team tells you to stop. Talk to your care team about the use of this medication in children. While it may be prescribed for children as young as 1 for selected conditions, precautions do apply. Overdosage: If you think you have taken too much of this medicine contact a poison control center or emergency room at  once. NOTE: This medicine is only for you. Do not share this medicine with others. What if I miss a dose? If you miss a dose, take it as soon as you can. If it is almost time for your next dose, take only that dose. Do not take double or extra doses. What may interact with this medication? Aliskiren ACE inhibitors, like enalapril or lisinopril Diuretics, especially amiloride, eplerenone, spironolactone, or triamterene Lithium NSAIDs, medications for pain and inflammation, like ibuprofen or naproxen Potassium salts or potassium supplements This list may not describe all possible interactions. Give your health care provider a list of all the medicines, herbs, non-prescription drugs, or dietary supplements you use. Also tell them if you smoke, drink alcohol, or use illegal drugs. Some items may interact with your medicine. What should I watch for while using this medication? Visit your care team for regular checks on your progress. Check your blood pressure as directed. Ask your care team what your blood pressure should be. Also, find out when you should contact him or her. Do not treat yourself for coughs, colds, or pain while you are taking this medication without asking your care team for advice. Some medications may increase your blood pressure. Women should inform their care team if they wish to become pregnant or think they might be pregnant. There is a potential for serious side effects to an unborn child. Talk to your care team for more information. You may  get drowsy or dizzy. Do not drive, use machinery, or do anything that needs mental alertness until you know how this medication affects you. Do not stand or sit up quickly, especially if you are an older patient. This reduces the risk of dizzy or fainting spells. Alcohol can make you more drowsy and dizzy. Avoid alcoholic drinks. Avoid salt substitutes unless you are told otherwise by your care team. What side effects may I notice from  receiving this medication? Side effects that you should report to your care team as soon as possible: Allergic reactions--skin rash, itching, hives, swelling of the face, lips, tongue, or throat High potassium level--muscle weakness, fast or irregular heartbeat Kidney injury--decrease in the amount of urine, swelling of the ankles, hands, or feet Low blood pressure--dizziness, feeling faint or lightheaded, blurry vision Side effects that usually do not require medical attention (report to your care team if they continue or are bothersome): Dizziness Fatigue Headache This list may not describe all possible side effects. Call your doctor for medical advice about side effects. You may report side effects to FDA at 1-800-FDA-1088. Where should I keep my medication? Keep out of the reach of children and pets. Store at room temperature between 20 and 25 degrees C (68 and 77 degrees F). Protect from moisture. Keep the container tightly closed. Get rid of any unused medication after the expiration date. To get rid of medications that are no longer needed or have expired: Take the medication to a medication take-back program. Check with your pharmacy or law enforcement to find a location. If you cannot return the medication, check the label or package insert to see if the medication should be thrown out in the garbage or flushed down the toilet. If you are not sure, ask your care team. If it is safe to put it in the trash, take the medication out of the container. Mix the medication with cat litter, dirt, coffee grounds, or other unwanted substance. Seal the mixture in a bag or container. Put it in the trash. NOTE: This sheet is a summary. It may not cover all possible information. If you have questions about this medicine, talk to your doctor, pharmacist, or health care provider.  2022 Elsevier/Gold Standard (2021-07-18 00:00:00)

## 2021-12-14 NOTE — Progress Notes (Signed)
Pt presents to establish care, has not seen provider in some time and has not been taking any BP meds, Pt complains of headaches daily with pain starting at back of head and neck

## 2021-12-15 DIAGNOSIS — H6123 Impacted cerumen, bilateral: Secondary | ICD-10-CM | POA: Insufficient documentation

## 2021-12-15 LAB — BASIC METABOLIC PANEL
BUN/Creatinine Ratio: 16 (ref 9–20)
BUN: 11 mg/dL (ref 6–24)
CO2: 24 mmol/L (ref 20–29)
Calcium: 9.3 mg/dL (ref 8.7–10.2)
Chloride: 106 mmol/L (ref 96–106)
Creatinine, Ser: 0.7 mg/dL — ABNORMAL LOW (ref 0.76–1.27)
Glucose: 105 mg/dL — ABNORMAL HIGH (ref 70–99)
Potassium: 3.9 mmol/L (ref 3.5–5.2)
Sodium: 145 mmol/L — ABNORMAL HIGH (ref 134–144)
eGFR: 115 mL/min/{1.73_m2} (ref >59)

## 2021-12-15 NOTE — Progress Notes (Signed)
Please call patient with update.   Kidney function normal.

## 2021-12-25 NOTE — Progress Notes (Signed)
Erroneous encounter

## 2021-12-28 ENCOUNTER — Encounter: Payer: Self-pay | Admitting: Gastroenterology

## 2021-12-28 ENCOUNTER — Encounter: Payer: 59 | Admitting: Family

## 2021-12-28 DIAGNOSIS — I10 Essential (primary) hypertension: Secondary | ICD-10-CM

## 2021-12-28 DIAGNOSIS — Z13220 Encounter for screening for lipoid disorders: Secondary | ICD-10-CM

## 2021-12-28 DIAGNOSIS — Z1159 Encounter for screening for other viral diseases: Secondary | ICD-10-CM

## 2021-12-28 DIAGNOSIS — Z131 Encounter for screening for diabetes mellitus: Secondary | ICD-10-CM

## 2021-12-28 DIAGNOSIS — Z0000 Encounter for general adult medical examination without abnormal findings: Secondary | ICD-10-CM

## 2021-12-28 DIAGNOSIS — Z13228 Encounter for screening for other metabolic disorders: Secondary | ICD-10-CM

## 2021-12-28 DIAGNOSIS — Z130 Encounter for screening for diseases of the blood and blood-forming organs and certain disorders involving the immune mechanism: Secondary | ICD-10-CM

## 2021-12-28 DIAGNOSIS — Z1211 Encounter for screening for malignant neoplasm of colon: Secondary | ICD-10-CM

## 2021-12-28 DIAGNOSIS — Z1329 Encounter for screening for other suspected endocrine disorder: Secondary | ICD-10-CM

## 2021-12-31 NOTE — Progress Notes (Addendum)
Patient ID: Carl Greene, male    DOB: April 25, 1976  MRN: [id number]  CC: Annual Physical Exam   Subjective: Carl Greene is a 46 y.o. male who presents for annual physical exam.   His concerns today include:   HYPERTENSION FOLLOW-UP: 12/14/2021: - Resume Amlodipine as prescribed.  - Begin Valsartan as prescribed. Counseled on medication compliance and adverse effects.   01/05/2022: Doing well on current regimen. No side effects. No issues/concerns. Denies chest pain and shortness of breath. Not checking blood pressures at home.   2. RIGHT FOOT PAIN: Began 3 days ago after a jack fell on his foot at work. His employer is aware. He was not wearing a steel toe boot. He was able to remain at work for the remainder of the shift as it was close to ending time. Endorses pain, swelling, and decreased sensation on upper plantar aspect of foot.   3. ASTHMA: Reports concerns of possible asthma. Told by others that he wheezes sometimes. Reports he has not noticed the wheezing himself. States the people that told him about his wheezing have asthma and say he sounds like them. He is no longer smoking. Would like official screening for asthma or other possible causes.    Patient Active Problem List   Diagnosis Date Noted   Goiter 04/23/2011   Tobacco use disorder 04/23/2011   HYPERTENSION, BENIGN ESSENTIAL 03/07/2010   DYSPNEA 03/07/2010   CHEST PAIN-UNSPECIFIED 03/07/2010     Current Outpatient Medications on File Prior to Visit  Medication Sig Dispense Refill   amLODipine (NORVASC) 10 MG tablet Take 1 tablet (10 mg total) by mouth daily. 90 tablet 0   omeprazole (PRILOSEC) 20 MG capsule Take 1 capsule (20 mg total) by mouth daily. 120 capsule 0   No current facility-administered medications on file prior to visit.    No Known Allergies  Social History   Socioeconomic History   Marital status: Single    Spouse name: Not on file   Number of children: Not on file   Years of  education: Not on file   Highest education level: Not on file  Occupational History   Not on file  Tobacco Use   Smoking status: Former    Packs/day: 0.50    Types: Cigarettes    Quit date: 03/12/2013    Years since quitting: 8.8   Smokeless tobacco: Former   Tobacco comments:    quit 4/11.  [name]. pt quit again 03/11/2013  Vaping Use   Vaping Use: Never used  Substance and Sexual Activity   Alcohol use: No   Drug use: Yes    Types: Marijuana   Sexual activity: Not on file  Other Topics Concern   Not on file  Social History Narrative   Not on file   Social Determinants of Health   Financial Resource Strain: Not on file  Food Insecurity: Not on file  Transportation Needs: Not on file  Physical Activity: Not on file  Stress: Not on file  Social Connections: Not on file  Intimate Partner Violence: Not on file    Family History  Problem Relation Age of Onset   Coronary artery disease Maternal Grandfather    Hypertension Mother     Past Surgical History:  Procedure Laterality Date   FRACTURE SURGERY     MANDIBLE FRACTURE SURGERY      ROS: Review of Systems Negative except as stated above  PHYSICAL EXAM: BP 119/77 (BP Location: Left Arm, Patient Position: Sitting, Cuff  Size: Normal)    Pulse 72    Temp 98.5 F (36.9 C)    Resp 18    Ht 5' 7.72" (1.72 m)    Wt 157 lb (71.2 kg)    SpO2 96%    BMI 24.07 kg/m   Physical Exam HENT:     Head: Normocephalic and atraumatic.     Right Ear: Tympanic membrane, ear canal and external ear normal.     Left Ear: Tympanic membrane, ear canal and external ear normal.  Eyes:     Extraocular Movements: Extraocular movements intact.     Conjunctiva/sclera: Conjunctivae normal.     Pupils: Pupils are equal, round, and reactive to light.  Cardiovascular:     Rate and Rhythm: Normal rate and regular rhythm.     Pulses: Normal pulses.     Heart sounds: Normal heart sounds.  Pulmonary:     Effort: Pulmonary effort is  normal.     Breath sounds: Normal breath sounds.  Abdominal:     General: Bowel sounds are normal.     Palpations: Abdomen is soft.  Genitourinary:    Comments: Patient declined exam.  Musculoskeletal:        General: Normal range of motion.     Cervical back: Normal range of motion and neck supple.     Comments: Right foot fifth toe with mild edema and erythema, no evidence of drainage or compromised skin integrity. Decreased sensation right plantar aspect of foot.   Skin:    General: Skin is warm and dry.     Capillary Refill: Capillary refill takes less than 2 seconds.  Neurological:     General: No focal deficit present.     Mental Status: He is alert and oriented to person, place, and time.  Psychiatric:        Mood and Affect: Mood normal.        Behavior: Behavior normal.   ASSESSMENT AND PLAN: 1. Annual physical exam: - Counseled on 150 minutes of exercise per week as tolerated, healthy eating (including decreased daily intake of saturated fats, cholesterol, added sugars, sodium), STI prevention, and routine healthcare maintenance.  2. Screening for metabolic disorder: - Patient not fasting will return at later date for labs.  - Hepatic function to screen liver. - Hepatic Function Panel; Future  3. Screening for deficiency anemia: - Patient not fasting will return at later date for labs.  - CBC to screen for anemia. - CBC; Future  4. Diabetes mellitus screening: - Patient not fasting will return at later date for labs.  - Hemoglobin A1c to screen for pre-diabetes/diabetes. - Hemoglobin A1c; Future  5. Screening cholesterol level: - Patient not fasting will return at later date for labs.  - Lipid panel to screen for high cholesterol.  - Lipid panel; Future  6. Thyroid disorder screen: - Patient not fasting will return at later date for labs.  - TSH to check thyroid function.  - TSH; Future  7. Colon cancer screening: - Referral to Gastroenterology for colon  cancer screening by colonoscopy. - Ambulatory referral to Gastroenterology  8. Need for hepatitis C screening test: - Hepatitis C antibody to screen for hepatitis C.  - Hepatitis C Antibody; Future  9. Essential (primary) hypertension: - Continue Amlodipine as prescribed. No refills needed as of present.  - Continue Valsartan as prescribed.  - Counseled on blood pressure goal of less than 130/80, low-sodium, DASH diet, medication compliance, 150 minutes of moderate intensity exercise per week  as tolerated. Discussed medication compliance, adverse effects. - Follow-up with primary provider in 3 months or sooner if needed.  - valsartan (DIOVAN) 40 MG tablet; Take 1 tablet (40 mg total) by mouth daily.  Dispense: 90 tablet; Refill: 0  10. Right foot pain: - Diagnostic x-ray right foot for further evaluation. Provided with work [employer].  - Follow-up with primary provider as scheduled. - DG Foot Complete Right  11. Wheezing: - Patient today in office without cardiopulmonary distress.  - Per patient request referral to Pulmonology for further evaluation and management.  - Ambulatory referral to Pulmonology  Patient was given the opportunity to ask questions.  Patient verbalized understanding of the plan and was able to repeat key elements of the plan. Patient was given clear instructions to go to Emergency Department or return to medical center if symptoms don't improve, worsen, or new problems develop.The patient verbalized understanding.   Orders Placed This Encounter  Procedures   DG Foot Complete Right   Hepatitis C Antibody   Hepatic Function Panel   CBC   Lipid panel   TSH   Hemoglobin A1c   Ambulatory referral to Gastroenterology   Ambulatory referral to Pulmonology     Requested Prescriptions   Signed Prescriptions Disp Refills   valsartan (DIOVAN) 40 MG tablet 90 tablet 0    Sig: Take 1 tablet (40 mg total) by mouth daily.    Return in about 1 year (around 01/05/2023)  for Physical per patient preference, Follow-Up or next available 3 months hypertension .  Camillia Herter, NP

## 2022-01-05 ENCOUNTER — Encounter (INDEPENDENT_AMBULATORY_CARE_PROVIDER_SITE_OTHER): Payer: Self-pay

## 2022-01-05 ENCOUNTER — Ambulatory Visit (INDEPENDENT_AMBULATORY_CARE_PROVIDER_SITE_OTHER): Payer: 59 | Admitting: Family

## 2022-01-05 ENCOUNTER — Ambulatory Visit (INDEPENDENT_AMBULATORY_CARE_PROVIDER_SITE_OTHER): Payer: 59

## 2022-01-05 ENCOUNTER — Other Ambulatory Visit: Payer: Self-pay

## 2022-01-05 ENCOUNTER — Encounter: Payer: Self-pay | Admitting: Family

## 2022-01-05 VITALS — BP 119/77 | HR 72 | Temp 98.5°F | Resp 18 | Ht 67.72 in | Wt 157.0 lb

## 2022-01-05 DIAGNOSIS — Z130 Encounter for screening for diseases of the blood and blood-forming organs and certain disorders involving the immune mechanism: Secondary | ICD-10-CM

## 2022-01-05 DIAGNOSIS — M79671 Pain in right foot: Secondary | ICD-10-CM | POA: Diagnosis not present

## 2022-01-05 DIAGNOSIS — Z1329 Encounter for screening for other suspected endocrine disorder: Secondary | ICD-10-CM

## 2022-01-05 DIAGNOSIS — Z13228 Encounter for screening for other metabolic disorders: Secondary | ICD-10-CM | POA: Diagnosis not present

## 2022-01-05 DIAGNOSIS — Z131 Encounter for screening for diabetes mellitus: Secondary | ICD-10-CM | POA: Diagnosis not present

## 2022-01-05 DIAGNOSIS — Z1159 Encounter for screening for other viral diseases: Secondary | ICD-10-CM

## 2022-01-05 DIAGNOSIS — I10 Essential (primary) hypertension: Secondary | ICD-10-CM

## 2022-01-05 DIAGNOSIS — Z0000 Encounter for general adult medical examination without abnormal findings: Secondary | ICD-10-CM

## 2022-01-05 DIAGNOSIS — R062 Wheezing: Secondary | ICD-10-CM

## 2022-01-05 DIAGNOSIS — Z13220 Encounter for screening for lipoid disorders: Secondary | ICD-10-CM

## 2022-01-05 DIAGNOSIS — Z1211 Encounter for screening for malignant neoplasm of colon: Secondary | ICD-10-CM

## 2022-01-05 MED ORDER — VALSARTAN 40 MG PO TABS: 40.0000 mg | ORAL_TABLET | Freq: Every day | ORAL | 0 refills | Status: DC

## 2022-01-05 NOTE — Progress Notes (Signed)
Pt present for annual physical exam

## 2022-01-05 NOTE — Progress Notes (Signed)
Pt presents for annual exam will return for fasting labs, Pt complains of right foot pain, jack fell on it at work

## 2022-01-05 NOTE — Addendum Note (Signed)
Addended by: Camillia Herter on: 01/05/2022 09:14 PM   Modules accepted: Orders

## 2022-01-08 ENCOUNTER — Other Ambulatory Visit: Payer: Self-pay

## 2022-01-08 ENCOUNTER — Encounter (INDEPENDENT_AMBULATORY_CARE_PROVIDER_SITE_OTHER): Payer: Self-pay

## 2022-01-08 ENCOUNTER — Other Ambulatory Visit: Payer: Self-pay | Admitting: Family

## 2022-01-08 ENCOUNTER — Other Ambulatory Visit: Payer: 59

## 2022-01-08 DIAGNOSIS — Z13220 Encounter for screening for lipoid disorders: Secondary | ICD-10-CM

## 2022-01-08 DIAGNOSIS — Z130 Encounter for screening for diseases of the blood and blood-forming organs and certain disorders involving the immune mechanism: Secondary | ICD-10-CM

## 2022-01-08 DIAGNOSIS — I10 Essential (primary) hypertension: Secondary | ICD-10-CM

## 2022-01-08 DIAGNOSIS — Z1159 Encounter for screening for other viral diseases: Secondary | ICD-10-CM

## 2022-01-08 DIAGNOSIS — Z131 Encounter for screening for diabetes mellitus: Secondary | ICD-10-CM

## 2022-01-08 DIAGNOSIS — Z1329 Encounter for screening for other suspected endocrine disorder: Secondary | ICD-10-CM

## 2022-01-08 DIAGNOSIS — Z13228 Encounter for screening for other metabolic disorders: Secondary | ICD-10-CM

## 2022-01-08 NOTE — Progress Notes (Signed)
Right foot no fracture or dislocation.   Right foot great toe with mild arthritis. Continue with over-the-counter pain management, rest, ice, elevation, and compression as needed. Follow-up with primary provider as needed.

## 2022-01-09 LAB — CBC
Hematocrit: 41 % (ref 37.5–51.0)
Hemoglobin: 13.6 g/dL (ref 13.0–17.7)
MCH: 27.2 pg (ref 26.6–33.0)
MCHC: 33.2 g/dL (ref 31.5–35.7)
MCV: 82 fL (ref 79–97)
Platelets: 272 10*3/uL (ref 150–450)
RBC: 5 x10E6/uL (ref 4.14–5.80)
RDW: 14.7 % (ref 11.6–15.4)
WBC: 5.4 10*3/uL (ref 3.4–10.8)

## 2022-01-09 LAB — HEPATIC FUNCTION PANEL
ALT: 27 IU/L (ref 0–44)
AST: 35 IU/L (ref 0–40)
Albumin: 4.6 g/dL (ref 4.0–5.0)
Alkaline Phosphatase: 121 IU/L (ref 44–121)
Bilirubin Total: <0.2 mg/dL (ref 0.0–1.2)
Bilirubin, Direct: <0.1 mg/dL (ref 0.00–0.40)
Total Protein: 7.1 g/dL (ref 6.0–8.5)

## 2022-01-09 LAB — LIPID PANEL
Chol/HDL Ratio: 3.3 ratio (ref 0.0–5.0)
Cholesterol, Total: 182 mg/dL (ref 100–199)
HDL: 56 mg/dL (ref >39)
LDL Chol Calc (NIH): 114 mg/dL — ABNORMAL HIGH (ref 0–99)
Triglycerides: 63 mg/dL (ref 0–149)
VLDL Cholesterol Cal: 12 mg/dL (ref 5–40)

## 2022-01-09 LAB — HEPATITIS C ANTIBODY: Hep C Virus Ab: NONREACTIVE

## 2022-01-09 LAB — TSH: TSH: 0.858 u[IU]/mL (ref 0.450–4.500)

## 2022-01-09 LAB — HEMOGLOBIN A1C
Est. average glucose Bld gHb Est-mCnc: 131 mg/dL
Hgb A1c MFr Bld: 6.2 % — ABNORMAL HIGH (ref 4.8–5.6)

## 2022-01-09 LAB — SPECIMEN STATUS REPORT

## 2022-01-09 NOTE — Progress Notes (Signed)
The following abnormalities are noted:   -  Cholesterol mildly higher than expected. High cholesterol may increase risk of heart attack and/or stroke. Consider eating more fruits, vegetables, and lean baked meats such as chicken or fish. Moderate intensity exercise at least 150 minutes as tolerated per week may help as well. No medication needed as of present.  -  Hemoglobin A1c is consistent with pre-diabetes. Practice healthy eating habits of fresh fruit and vegetables, lean baked meats such as chicken, fish, and [place]; limit breads, rice, pastas, and desserts; practice regular aerobic exercise (at least 150 minutes a week as tolerated). No medication needed as of present.  All other values are normal, stable or within acceptable limits.  Medication changes / Follow up labs / Other changes or recommendations:   - Recheck fasting cholesterol routinely.  - Recheck prediabetes in 6 months.   Camillia Herter, NP 01/09/2022 9:09 AM

## 2022-01-10 ENCOUNTER — Encounter: Payer: Self-pay | Admitting: Family

## 2022-01-15 ENCOUNTER — Telehealth: Payer: Self-pay | Admitting: Family Medicine

## 2022-01-15 NOTE — Telephone Encounter (Signed)
Pt called requesting that his RX for Valsartan be resent to the [facility] on [address]. [facility] is too expensive. ?

## 2022-01-16 ENCOUNTER — Other Ambulatory Visit: Payer: Self-pay | Admitting: *Deleted

## 2022-01-16 ENCOUNTER — Telehealth: Payer: Self-pay | Admitting: Family

## 2022-01-16 ENCOUNTER — Other Ambulatory Visit: Payer: Self-pay | Admitting: Family

## 2022-01-16 DIAGNOSIS — I10 Essential (primary) hypertension: Secondary | ICD-10-CM

## 2022-01-16 MED ORDER — VALSARTAN 40 MG PO TABS: 40.0000 mg | ORAL_TABLET | Freq: Every day | ORAL | 0 refills | Status: DC

## 2022-01-16 NOTE — Telephone Encounter (Signed)
Please check on this. I have not seen this patient. I think Amy is the PCP. Thanks. ?

## 2022-01-16 NOTE — Telephone Encounter (Signed)
Pt called requesting that his RX for Valsartan  ?valsartan (DIOVAN) 40 MG tablet [[id number]]  ?be resent to the [facility] on [address].  ?Pt is low on BP MEDS ?([facility] is too expensive.) ?

## 2022-01-18 ENCOUNTER — Ambulatory Visit: Payer: 59 | Admitting: Gastroenterology

## 2022-02-06 ENCOUNTER — Encounter: Payer: Self-pay | Admitting: Gastroenterology

## 2022-02-06 ENCOUNTER — Ambulatory Visit (INDEPENDENT_AMBULATORY_CARE_PROVIDER_SITE_OTHER): Payer: 59 | Admitting: Gastroenterology

## 2022-02-06 VITALS — BP 136/92 | HR 88 | Ht 67.75 in | Wt 155.2 lb

## 2022-02-06 DIAGNOSIS — Z1212 Encounter for screening for malignant neoplasm of rectum: Secondary | ICD-10-CM | POA: Diagnosis not present

## 2022-02-06 DIAGNOSIS — R1310 Dysphagia, unspecified: Secondary | ICD-10-CM | POA: Diagnosis not present

## 2022-02-06 DIAGNOSIS — Z1211 Encounter for screening for malignant neoplasm of colon: Secondary | ICD-10-CM

## 2022-02-06 MED ORDER — PLENVU 140 G PO SOLR: ORAL | 0 refills | Status: DC

## 2022-02-06 NOTE — Progress Notes (Signed)
? ?HPI : Carl Greene is a very pleasant 46 year old male with a history of hypertension who is referred to [place] by [name], NP for further evaluation of dysphagia.  Patient reports he has had swallowing issues for many years.  He has infrequent episodes of feeling like food is stuck in his chest, causing excessive salivation and nausea, sometimes requiring forceful emesis.  He recalls one episode when he was eating in the car and had to pull over and vomit to get rid of the food stuck in his chest.  He notices this occurs more often with breads, is not sure that meats are a significant problem for him.  He reports occasional episodes of burning pain in his epigastrium and chest, but these are not frequent.  Acid regurgitation is not a typical problem for him.  He denies problems with nausea or vomiting in the absence of a dysphagia episode. ?He has been taking omeprazole on a daily basis since February.  He has not had any reflux symptoms since then, but he is not sure if it has helped his swallowing.  He used to take Goody's powder frequently for headaches, but has since stopped since he learned the potential problem with stomach ulcers. ?He has normal bowel movements, without problems of constipation, diarrhea or blood in the stool. ?No family history of GI malignancy.  He has never undergone upper or lower endoscopy. ?He has lost 20 pounds recently which she attributes to change in activity related to a new job (much more active at work). ? ? ?Past Medical History:  ?Diagnosis Date  ? Chest pain   ? Echo EF 50-55%. [facility] study 4/11: walked 9:15 no ecg changes. EF 42%?  inferoir attenuation no scar.   ? Dyspnea   ? HTN (hypertension)   ? Hx of gastric ulcer   ? Lightheadedness   ? with CP  ? ? ? ?Past Surgical History:  ?Procedure Laterality Date  ? FRACTURE SURGERY    ? MANDIBLE FRACTURE SURGERY    ? ?Family History  ?Problem Relation Age of Onset  ? Hypertension Mother   ? Colon polyps Maternal Uncle   ?  Coronary artery disease Maternal Grandfather   ? Diabetes Mellitus I Other   ? Colon cancer Neg Hx   ? Esophageal cancer Neg Hx   ? Pancreatic cancer Neg Hx   ? Stomach cancer Neg Hx   ? ?Social History  ? ?Tobacco Use  ? Smoking status: Former  ?  Packs/day: 0.50  ?  Types: Cigarettes  ?  Quit date: 03/12/2013  ?  Years since quitting: 8.9  ? Smokeless tobacco: Never  ?Vaping Use  ? Vaping Use: Never used  ?Substance Use Topics  ? Alcohol use: No  ? Drug use: Yes  ?  Types: Marijuana  ? ?Current Outpatient Medications  ?Medication Sig Dispense Refill  ? amLODipine (NORVASC) 10 MG tablet Take 1 tablet (10 mg total) by mouth daily. 90 tablet 0  ? omeprazole (PRILOSEC) 20 MG capsule Take 1 capsule (20 mg total) by mouth daily. 120 capsule 0  ? valsartan (DIOVAN) 40 MG tablet Take 1 tablet (40 mg total) by mouth daily. 90 tablet 0  ? ?No current facility-administered medications for this visit.  ? ?No Known Allergies ? ? ?Review of Systems: ?All systems reviewed and negative except where noted in HPI.  ? ? ?No results found. ? ?Physical Exam: ?BP (!) 136/92   Pulse 88   Ht 5' 7.75" (1.721 m)  Wt 155 lb 4 oz (70.4 kg)   BMI 23.78 kg/m?  ?Constitutional: Pleasant,well-developed, African-American male in no acute distress. ?HEENT: Normocephalic and atraumatic. Conjunctivae are normal. No scleral icterus. ?Neck supple.  ?Cardiovascular: Normal rate, regular rhythm.  ?Pulmonary/chest: Effort normal and breath sounds normal. No wheezing, rales or rhonchi. ?Abdominal: Soft, nondistended, nontender. Bowel sounds active throughout. There are no masses palpable. No hepatomegaly. ?Extremities: no edema ?Neurological: Alert and oriented to person place and time. ?Skin: Skin is warm and dry. No rashes noted. ?Psychiatric: Normal mood and affect. Behavior is normal. ? ?CBC ?   ?Component Value Date/Time  ? WBC 5.4 01/08/2022 0000  ? WBC 6.7 04/02/2020 1714  ? RBC 5.00 01/08/2022 0000  ? RBC 4.66 04/02/2020 1714  ? HGB 13.6  01/08/2022 0000  ? HCT 41.0 01/08/2022 0000  ? PLT 272 01/08/2022 0000  ? MCV 82 01/08/2022 0000  ? MCH 27.2 01/08/2022 0000  ? MCH 27.5 04/02/2020 1714  ? MCHC 33.2 01/08/2022 0000  ? MCHC 33.5 04/02/2020 1714  ? RDW 14.7 01/08/2022 0000  ? LYMPHSABS 2.0 06/23/2013 1344  ? MONOABS 0.4 06/23/2013 1344  ? EOSABS 0.1 06/23/2013 1344  ? BASOSABS 0.1 06/23/2013 1344  ? ? ?CMP  ?   ?Component Value Date/Time  ? NA 145 (H) 12/14/2021 1112  ? K 3.9 12/14/2021 1112  ? CL 106 12/14/2021 1112  ? CO2 24 12/14/2021 1112  ? GLUCOSE 105 (H) 12/14/2021 1112  ? GLUCOSE 120 (H) 04/02/2020 1714  ? BUN 11 12/14/2021 1112  ? CREATININE 0.70 (L) 12/14/2021 1112  ? CALCIUM 9.3 12/14/2021 1112  ? PROT 7.1 01/08/2022 0000  ? ALBUMIN 4.6 01/08/2022 0000  ? AST 35 01/08/2022 0000  ? ALT 27 01/08/2022 0000  ? ALKPHOS 121 01/08/2022 0000  ? BILITOT <0.2 01/08/2022 0000  ? GFRNONAA >60 04/02/2020 1714  ? GFRAA >60 04/02/2020 1714  ? ? ? ?ASSESSMENT AND PLAN: ?46 year old male with longstanding, nonprogressive symptoms of solid dysphagia, with occasional GERD symptoms, also due for initial average risk colon cancer screening.  Differential for his dysphagia includes GERD/peptic stricture versus eosinophilic esophagitis.  We will schedule patient for EGD with possible dilation.  We will schedule patient for routine screening colonoscopy at same time. ? ?Dysphagia ?- EGD with possible dilation ? ?CRC screening ?- Colonoscopy ? ?The details, risks (including bleeding, perforation, infection, missed lesions, medication reactions and possible hospitalization or surgery if complications occur), benefits, and alternatives to EGD/colonoscopy with possible biopsy, dilation and polypectomy were discussed with the patient and he consents to proceed.  ? ?Stephania Macfarlane E. Candis Schatz, MD ?[facility] ? ? ?CC:  Camillia Herter, NP ? ?

## 2022-02-06 NOTE — Patient Instructions (Signed)
If you are age 47 or older, your body mass index should be between 23-30. Your Body mass index is 23.78 kg/m?[name] If this is out of the aforementioned range listed, please consider follow up with your Primary Care Provider. ? ?If you are age 58 or younger, your body mass index should be between 19-25. Your Body mass index is 23.78 kg/m?[name] If this is out of the aformentioned range listed, please consider follow up with your Primary Care Provider.  ? ?You have been scheduled for an endoscopy and colonoscopy. Please follow the written instructions given to you at your visit today. ?Please pick up your prep supplies at the pharmacy within the next 1-3 days. ?If you use inhalers (even only as needed), please bring them with you on the day of your procedure.  ? ?The [facility] providers would like to encourage you to use [facility] to communicate with providers for non-urgent requests or questions.  Due to long hold times on the telephone, sending your provider a message by [facility] may be a faster and more efficient way to get a response.  Please allow 48 business hours for a response.  Please remember that this is for non-urgent requests.  ? ?It was a pleasure to see you today! ? ?Thank you for trusting me with your gastrointestinal care!   ? ?Scott E.Candis Schatz, MD  ? ?

## 2022-03-05 ENCOUNTER — Other Ambulatory Visit: Payer: Self-pay

## 2022-03-05 ENCOUNTER — Encounter: Payer: Self-pay | Admitting: Family

## 2022-03-05 ENCOUNTER — Telehealth: Payer: Self-pay | Admitting: Gastroenterology

## 2022-03-05 MED ORDER — PLENVU 140 G PO SOLR: ORAL | 0 refills | Status: DC

## 2022-03-05 NOTE — Telephone Encounter (Signed)
Inbound call from patient stating that he needs Plenvu resent to pharmacy. Please advise.  ?

## 2022-03-05 NOTE — Telephone Encounter (Signed)
Plenvu has been resent to pharmacy. ?

## 2022-03-07 ENCOUNTER — Ambulatory Visit (INDEPENDENT_AMBULATORY_CARE_PROVIDER_SITE_OTHER): Payer: 59

## 2022-03-07 ENCOUNTER — Ambulatory Visit
Admission: EM | Admit: 2022-03-07 | Discharge: 2022-03-07 | Disposition: A | Discharge status: Home / Self Care | Payer: 59 | Attending: Internal Medicine | Admitting: Internal Medicine

## 2022-03-07 DIAGNOSIS — M533 Sacrococcygeal disorders, not elsewhere classified: Secondary | ICD-10-CM

## 2022-03-07 DIAGNOSIS — M5442 Lumbago with sciatica, left side: Secondary | ICD-10-CM

## 2022-03-07 MED ORDER — CYCLOBENZAPRINE HCL 5 MG PO TABS: 5.0000 mg | ORAL_TABLET | Freq: Two times a day (BID) | ORAL | 0 refills | Status: DC | PRN

## 2022-03-07 MED ORDER — PREDNISONE 20 MG PO TABS: 40.0000 mg | ORAL_TABLET | Freq: Every day | ORAL | 0 refills | Status: AC

## 2022-03-07 NOTE — Discharge Instructions (Addendum)
It appears that you are having low back pain with sciatica.  This is being treated with prednisone and muscle relaxer.  Do not drive while taking this muscle relaxer as that can cause drowsiness.  Also alternate ice and heat to affected area.  Follow-up if symptoms persist or worsen. ?

## 2022-03-07 NOTE — ED Triage Notes (Signed)
Pt c/o pain on tailbone. States this occurs ~ every month but this time pt is tearful 2/2 pain.  ?

## 2022-03-07 NOTE — ED Provider Notes (Signed)
?[place] ? ? ? ?CSN: [id number] ?Arrival date & time: 03/07/22  1035 ? ? ?  ? ?History   ?Chief Complaint ?Chief Complaint  ?Patient presents with  ? pain in tailbone  ? ? ?HPI ?Carl Greene is a 46 y.o. male.  ? ?Patient presents with left lower back pain that extends into buttocks and radiates down left leg that has been present for approximately 3 days.  Patient reports history of the same with flareups approximately every month that last about every week and typically self resolve.  Denies any apparent injury but patient reports that he does a lot of heavy lifting at his workplace.  Denies any numbness or tingling.  Denies fevers, body aches, chills.  Movement exacerbates pain.  Patient has taken Excedrin for pain with no improvement.  Patient reports that he sometimes has left hip pain when the flareups occur.  Denies urinary frequency, urinary or bowel continence, saddle anesthesia. ? ? ? ?Past Medical History:  ?Diagnosis Date  ? Chest pain   ? Echo EF 50-55%. [facility] study 4/11: walked 9:15 no ecg changes. EF 42%?  inferoir attenuation no scar.   ? Dyspnea   ? HTN (hypertension)   ? Hx of gastric ulcer   ? Lightheadedness   ? with CP  ? ? ?Patient Active Problem List  ? Diagnosis Date Noted  ? Goiter 04/23/2011  ? Tobacco use disorder 04/23/2011  ? HYPERTENSION, BENIGN ESSENTIAL 03/07/2010  ? DYSPNEA 03/07/2010  ? CHEST PAIN-UNSPECIFIED 03/07/2010  ? ? ?Past Surgical History:  ?Procedure Laterality Date  ? FRACTURE SURGERY    ? MANDIBLE FRACTURE SURGERY    ? ? ? ? ? ?Home Medications   ? ?Prior to Admission medications   ?Medication Sig Start Date End Date Taking? Authorizing Provider  ?cyclobenzaprine (FLEXERIL) 5 MG tablet Take 1 tablet (5 mg total) by mouth 2 (two) times daily as needed for muscle spasms. 03/07/22  Yes Teodora Medici, FNP  ?predniSONE (DELTASONE) 20 MG tablet Take 2 tablets (40 mg total) by mouth daily for 5 days. 03/07/22 03/12/22 Yes Caliann Leckrone, Michele Rockers, FNP  ?amLODipine  (NORVASC) 10 MG tablet Take 1 tablet (10 mg total) by mouth daily. 12/14/21 03/14/22  Camillia Herter, NP  ?omeprazole (PRILOSEC) 20 MG capsule Take 1 capsule (20 mg total) by mouth daily. 12/14/21   Camillia Herter, NP  ?PEG-KCl-NaCl-NaSulf-Na Asc-C (PLENVU) 140 g SOLR Use as directed. Manufacturer's coupon Universal coupon code:BIN: P2366821; GROUP: [id number]; PCN: [id number]; ID: [id number]; PAY NO MORE $50; NO prior authorization 03/05/22   Daryel November, MD  ?valsartan (DIOVAN) 40 MG tablet Take 1 tablet (40 mg total) by mouth daily. 01/16/22 04/16/22  Camillia Herter, NP  ? ? ?Family History ?Family History  ?Problem Relation Age of Onset  ? Hypertension Mother   ? Colon polyps Maternal Uncle   ? Coronary artery disease Maternal Grandfather   ? Diabetes Mellitus I Other   ? Colon cancer Neg Hx   ? Esophageal cancer Neg Hx   ? Pancreatic cancer Neg Hx   ? Stomach cancer Neg Hx   ? ? ?Social History ?Social History  ? ?Tobacco Use  ? Smoking status: Former  ?  Packs/day: 0.50  ?  Types: Cigarettes  ?  Quit date: 03/12/2013  ?  Years since quitting: 8.9  ? Smokeless tobacco: Never  ?Vaping Use  ? Vaping Use: Never used  ?Substance Use Topics  ? Alcohol use: No  ? Drug  use: Yes  ?  Types: Marijuana  ? ? ? ?Allergies   ?Patient has no known allergies. ? ? ?Review of Systems ?Review of Systems ?Per HPI ? ?Physical Exam ?Triage Vital Signs ?ED Triage Vitals [03/07/22 1044]  ?Enc Vitals Group  ?   BP (!) 150/90  ?   Pulse Rate 77  ?   Resp 18  ?   Temp 98 ?F (36.7 ?C)  ?   Temp Source Oral  ?   SpO2 98 %  ?   Weight   ?   Height   ?   Head Circumference   ?   Peak Flow   ?   Pain Score 0  ?   Pain Loc   ?   Pain Edu?   ?   Excl. in [place]?   ? ?No data found. ? ?Updated Vital Signs ?BP (!) 150/90 (BP Location: Left Arm)   Pulse 77   Temp 98 ?F (36.7 ?C) (Oral)   Resp 18   SpO2 98%  ? ?Visual Acuity ?Right Eye Distance:   ?Left Eye Distance:   ?Bilateral Distance:   ? ?Right Eye Near:   ?Left Eye Near:    ?Bilateral Near:     ? ?Physical Exam ?Exam conducted with a chaperone present.  ?Constitutional:   ?   General: He is not in acute distress. ?   Appearance: Normal appearance. He is not toxic-appearing or diaphoretic.  ?HENT:  ?   Head: Normocephalic and atraumatic.  ?Eyes:  ?   Extraocular Movements: Extraocular movements intact.  ?   Conjunctiva/sclera: Conjunctivae normal.  ?Pulmonary:  ?   Effort: Pulmonary effort is normal.  ?Musculoskeletal:  ?   Lumbar back: Positive left straight leg raise test. Negative right straight leg raise test.  ?   Comments: Tenderness to palpation to lower back.  There is direct spinal tenderness.  No crepitus or step-off noted.  Tenderness generalized throughout left buttocks.  ?Skin: ?   Comments: No induration or fluctuance noted to area of pain that would indicate abscess.   ?Neurological:  ?   General: No focal deficit present.  ?   Mental Status: He is alert and oriented to person, place, and time. Mental status is at baseline.  ?   Deep Tendon Reflexes: Reflexes are normal and symmetric.  ?Psychiatric:     ?   Mood and Affect: Mood normal.     ?   Behavior: Behavior normal.     ?   Thought Content: Thought content normal.     ?   Judgment: Judgment normal.  ? ? ? ?UC Treatments / Results  ?Labs ?(all labs ordered are listed, but only abnormal results are displayed) ?Labs Reviewed - No data to display ? ?EKG ? ? ?Radiology ?DG Lumbar Spine Complete ? ?Result Date: 03/07/2022 ?CLINICAL DATA:  Tailbone pain EXAM: LUMBAR SPINE - COMPLETE 4+ VIEW COMPARISON:  None. FINDINGS: Normal alignment of lumbar vertebral bodies. No loss of vertebral body height or disc height. No pars fracture. No subluxation. IMPRESSION: No acute or chronic findings lumbar spine. Electronically Signed   By: Suzy Bouchard M.D.   On: 03/07/2022 11:31  ? ?DG Sacrum/Coccyx ? ?Result Date: 03/07/2022 ?CLINICAL DATA:  Tailbone pain EXAM: SACRUM AND COCCYX - 2 VIEW COMPARISON:  None. FINDINGS: There is no evidence of fracture or  other focal bone lesions. Calcifications in the pelvis, likely due to phleboliths. Bilateral SI joints and pubic symphysis are unremarkable with no  significant degenerative changes. IMPRESSION: No acute osseous abnormalities. Electronically Signed   By: Yetta Glassman M.D.   On: 03/07/2022 11:31   ? ?Procedures ?Procedures (including critical care time) ? ?Medications Ordered in UC ?Medications - No data to display ? ?Initial Impression / Assessment and Plan / UC Course  ?I have reviewed the triage vital signs and the nursing notes. ? ?Pertinent labs & imaging results that were available during my care of the patient were reviewed by me and considered in my medical decision making (see chart for details). ? ?  ? ?Physical exam is consistent with lower back pain with sciatica.  No concern for abscess.  X-rays are negative for any acute bony abnormality.  There is phleboliths noted on x-ray and patient was educated on this.  Do not think this is causing patient's pain as it is an incidental finding.  Will prescribe prednisone steroid to decrease inflammation as well as muscle relaxer.  Patient was advised that muscle relaxer can cause drowsiness.  Discussed alternating heat and ice application as well.  Discussed return precautions.  Patient verbalized understanding and was agreeable with plan. ?Final Clinical Impressions(s) / UC Diagnoses  ? ?Final diagnoses:  ?Acute left-sided low back pain with left-sided sciatica  ? ? ? ?Discharge Instructions   ? ?  ?It appears that you are having low back pain with sciatica.  This is being treated with prednisone and muscle relaxer.  Do not drive while taking this muscle relaxer as that can cause drowsiness.  Also alternate ice and heat to affected area.  Follow-up if symptoms persist or worsen. ? ? ? ? ?ED Prescriptions   ? ? Medication Sig Dispense Auth. Provider  ? predniSONE (DELTASONE) 20 MG tablet Take 2 tablets (40 mg total) by mouth daily for 5 days. 10 tablet Teodora Medici, [place]  ? cyclobenzaprine (FLEXERIL) 5 MG tablet Take 1 tablet (5 mg total) by mouth 2 (two) times daily as needed for muscle spasms. 20 tablet Teodora Medici, [place]  ? ?  ? ?I have reviewed the PDMP during this enco

## 2022-03-12 ENCOUNTER — Other Ambulatory Visit: Payer: Self-pay

## 2022-03-12 ENCOUNTER — Encounter: Payer: Self-pay | Admitting: Gastroenterology

## 2022-03-12 ENCOUNTER — Ambulatory Visit (AMBULATORY_SURGERY_CENTER): Payer: Managed Care, Other (non HMO) | Admitting: Gastroenterology

## 2022-03-12 VITALS — BP 145/106 | HR 78 | Temp 98.3°F | Resp 14 | Ht 67.0 in | Wt 155.0 lb

## 2022-03-12 DIAGNOSIS — D120 Benign neoplasm of cecum: Secondary | ICD-10-CM

## 2022-03-12 DIAGNOSIS — Z1211 Encounter for screening for malignant neoplasm of colon: Secondary | ICD-10-CM

## 2022-03-12 DIAGNOSIS — K219 Gastro-esophageal reflux disease without esophagitis: Secondary | ICD-10-CM

## 2022-03-12 DIAGNOSIS — D125 Benign neoplasm of sigmoid colon: Secondary | ICD-10-CM

## 2022-03-12 DIAGNOSIS — D123 Benign neoplasm of transverse colon: Secondary | ICD-10-CM

## 2022-03-12 DIAGNOSIS — Z1212 Encounter for screening for malignant neoplasm of rectum: Secondary | ICD-10-CM

## 2022-03-12 DIAGNOSIS — D124 Benign neoplasm of descending colon: Secondary | ICD-10-CM | POA: Diagnosis not present

## 2022-03-12 DIAGNOSIS — R1310 Dysphagia, unspecified: Secondary | ICD-10-CM | POA: Diagnosis present

## 2022-03-12 MED ORDER — SODIUM CHLORIDE 0.9 % IV SOLN: 500.0000 mL | Freq: Once | INTRAVENOUS | Status: DC

## 2022-03-12 MED ORDER — OMEPRAZOLE 20 MG PO CPDR: 20.0000 mg | DELAYED_RELEASE_CAPSULE | Freq: Every day | ORAL | 0 refills | Status: DC

## 2022-03-12 NOTE — Progress Notes (Signed)
[facility] History and Physical ? ? ?Primary Care Physician:  Camillia Herter, NP ? ? ?Reason for Procedure:   Colon cancer screening, dysphagia ? ?Plan:    Colonoscopy, EGD with possible dilation ? ? ? ? ?HPI: Carl Greene is a 46 y.o. male undergoing initial average risk screening colonoscopy.  He has no family history of colon cancer and no chronic lower GI symptoms.  He has chronic solid dysphagia with occasional GERD symptoms, undergoing EGD with possible dilation ? ? ?Past Medical History:  ?Diagnosis Date  ? Chest pain   ? Echo EF 50-55%. [facility] study 4/11: walked 9:15 no ecg changes. EF 42%?  inferoir attenuation no scar.   ? Dyspnea   ? GERD (gastroesophageal reflux disease)   ? HTN (hypertension)   ? Hx of gastric ulcer   ? Lightheadedness   ? with CP  ? ? ?Past Surgical History:  ?Procedure Laterality Date  ? FRACTURE SURGERY    ? MANDIBLE FRACTURE SURGERY    ? ? ?Prior to Admission medications   ?Medication Sig Start Date End Date Taking? Authorizing Provider  ?amLODipine (NORVASC) 10 MG tablet Take 1 tablet (10 mg total) by mouth daily. 12/14/21 03/14/22 Yes Minette Brine, Amy J, NP  ?omeprazole (PRILOSEC) 20 MG capsule Take 1 capsule (20 mg total) by mouth daily. 12/14/21  Yes Minette Brine, Amy J, NP  ?predniSONE (DELTASONE) 20 MG tablet Take 2 tablets (40 mg total) by mouth daily for 5 days. 03/07/22 03/12/22 Yes [place], Michele Rockers, FNP  ?valsartan (DIOVAN) 40 MG tablet Take 1 tablet (40 mg total) by mouth daily. 01/16/22 04/16/22 Yes Camillia Herter, NP  ?cyclobenzaprine (FLEXERIL) 5 MG tablet Take 1 tablet (5 mg total) by mouth 2 (two) times daily as needed for muscle spasms. 03/07/22   Teodora Medici, FNP  ? ? ?Current Outpatient Medications  ?Medication Sig Dispense Refill  ? amLODipine (NORVASC) 10 MG tablet Take 1 tablet (10 mg total) by mouth daily. 90 tablet 0  ? omeprazole (PRILOSEC) 20 MG capsule Take 1 capsule (20 mg total) by mouth daily. 120 capsule 0  ? predniSONE (DELTASONE) 20 MG tablet Take 2  tablets (40 mg total) by mouth daily for 5 days. 10 tablet 0  ? valsartan (DIOVAN) 40 MG tablet Take 1 tablet (40 mg total) by mouth daily. 90 tablet 0  ? cyclobenzaprine (FLEXERIL) 5 MG tablet Take 1 tablet (5 mg total) by mouth 2 (two) times daily as needed for muscle spasms. 20 tablet 0  ? ?Current Facility-Administered Medications  ?Medication Dose Route Frequency Provider Last Rate Last Admin  ? 0.9 %  sodium chloride infusion  500 mL Intravenous Once Daryel November, MD      ? ? ?Allergies as of 03/12/2022  ? (No Known Allergies)  ? ? ?Family History  ?Problem Relation Age of Onset  ? Hypertension Mother   ? Colon polyps Maternal Uncle   ? Coronary artery disease Maternal Grandfather   ? Diabetes Mellitus I Other   ? Colon cancer Neg Hx   ? Esophageal cancer Neg Hx   ? Pancreatic cancer Neg Hx   ? Stomach cancer Neg Hx   ? ? ?Social History  ? ?Socioeconomic History  ? Marital status: Single  ?  Spouse name: Not on file  ? Number of children: Not on file  ? Years of education: Not on file  ? Highest education level: Not on file  ?Occupational History  ? Not on file  ?Tobacco  Use  ? Smoking status: Former  ?  Packs/day: 0.50  ?  Types: Cigarettes  ?  Quit date: 03/12/2013  ?  Years since quitting: 9.0  ? Smokeless tobacco: Never  ?Vaping Use  ? Vaping Use: Never used  ?Substance and Sexual Activity  ? Alcohol use: No  ? Drug use: Yes  ?  Types: Marijuana  ? Sexual activity: Not on file  ?Other Topics Concern  ? Not on file  ?Social History Narrative  ? Not on file  ? ?Social Determinants of Health  ? ?Financial Resource Strain: Not on file  ?Food Insecurity: Not on file  ?Transportation Needs: Not on file  ?Physical Activity: Not on file  ?Stress: Not on file  ?Social Connections: Not on file  ?Intimate Partner Violence: Not on file  ? ? ?Review of Systems: ? ?All other review of systems negative except as mentioned in the HPI. ? ?Physical Exam: ?Vital signs ?BP (!) 142/99   Pulse 88   Temp 98.3 ?F (36.8  ?C)   Ht '5\' 7"'$  (1.702 m)   Wt 155 lb (70.3 kg)   SpO2 100%   BMI 24.28 kg/m?  ? ?General:   Alert,  Well-developed, well-nourished, pleasant and cooperative in NAD ?Airway:  Mallampati 3 ?Lungs:  Clear throughout to auscultation.   ?Heart:  Regular rate and rhythm; no murmurs, clicks, rubs,  or gallops. ?Abdomen:  Soft, nontender and nondistended. Normal bowel sounds.   ?Neuro/Psych:  Normal mood and affect. A and O x 3 ? ? ?Torrie Namba E. Candis Schatz, MD ?[facility] ? ?

## 2022-03-12 NOTE — Op Note (Signed)
[name] ?Procedure Date: 03/12/2022 2:10 PM ?MRN: [id number] ?Endoscopist: Brendia Dampier E. Candis Schatz , MD ?Age: 46 ?Referring MD:  ?Date of Birth: September 05, 1976 ?Gender: Male ?Account #: [id number] ?Procedure:                Upper GI endoscopy ?Indications:              Dysphagia ?Medicines:                Monitored Anesthesia Care ?Procedure:                Pre-Anesthesia Assessment: ?                          - Prior to the procedure, a History and Physical  ?                          was performed, and patient medications and  ?                          allergies were reviewed. The patient's tolerance of  ?                          previous anesthesia was also reviewed. The risks  ?                          and benefits of the procedure and the sedation  ?                          options and risks were discussed with the patient.  ?                          All questions were answered, and informed consent  ?                          was obtained. Prior Anticoagulants: The patient has  ?                          taken no previous anticoagulant or antiplatelet  ?                          agents except for aspirin. ASA Grade Assessment: II  ?                          - A patient with mild systemic disease. After  ?                          reviewing the risks and benefits, the patient was  ?                          deemed in satisfactory condition to undergo the  ?                          procedure. ?  After obtaining informed consent, the endoscope was  ?                          passed under direct vision. Throughout the  ?                          procedure, the patient's blood pressure, pulse, and  ?                          oxygen saturations were monitored continuously. The  ?                          Endoscope was introduced through the mouth, and  ?                          advanced to the second part of duodenum. The upper  ?                           GI endoscopy was accomplished without difficulty.  ?                          The patient tolerated the procedure well. ?Scope In: ?Scope Out: ?Findings:                 One benign-appearing, intrinsic mild stenosis was  ?                          found at the gastroesophageal junction. This  ?                          stenosis measured 1.5 cm (inner diameter) x less  ?                          than one cm (in length). The stenosis was  ?                          traversed. A TTS dilator was passed through the  ?                          scope. Dilation with an 18-19-20 mm balloon dilator  ?                          was performed to 20 mm. The dilation site was  ?                          examined and showed mild mucosal disruption.  ?                          Estimated blood loss was minimal. ?                          The exam of the esophagus was otherwise normal. ?  Biopsies were obtained in the upper third of the  ?                          esophagus and in the lower third of the esophagus  ?                          with cold forceps for evaluation of eosinophilic  ?                          esophagitis. Estimated blood loss was minimal. ?                          The entire examined stomach was normal. ?                          The examined duodenum was normal. ?Complications:            No immediate complications. ?Estimated Blood Loss:     Estimated blood loss was minimal. ?Impression:               - Benign-appearing esophageal stenosis. Dilated. ?                          - Normal stomach. ?                          - Normal examined duodenum. ?                          - Biopsies were obtained in the upper third of the  ?                          esophagus and in the lower third of the esophagus  ?                          to exclude eosinophilic esophagitis. ?Recommendation:           - Patient has a contact number available for  ?                          emergencies. The  signs and symptoms of potential  ?                          delayed complications were discussed with the  ?                          patient. Return to normal activities tomorrow.  ?                          Written discharge instructions were provided to the  ?                          patient. ?                          - Resume previous diet. ?                          -  Continue present medications. ?                          - Await pathology results. ?Rashawnda Gaba E. Candis Schatz, MD ?03/12/2022 2:59:40 PM ?This report has been signed electronically. ?

## 2022-03-12 NOTE — Op Note (Signed)
[facility] ?Patient Name: Carl Greene ?Procedure Date: 03/12/2022 2:10 PM ?MRN: [id number] ?Endoscopist: Kekoa Fyock E. Candis Schatz , MD ?Age: 46 ?Referring MD:  ?Date of Birth: 1976-06-23 ?Gender: Male ?Account #: [id number] ?Procedure:                Colonoscopy ?Indications:              Screening for colorectal malignant neoplasm, This  ?                          is the patient's first colonoscopy ?Medicines:                Monitored Anesthesia Care ?Procedure:                Pre-Anesthesia Assessment: ?                          - Prior to the procedure, a History and Physical  ?                          was performed, and patient medications and  ?                          allergies were reviewed. The patient's tolerance of  ?                          previous anesthesia was also reviewed. The risks  ?                          and benefits of the procedure and the sedation  ?                          options and risks were discussed with the patient.  ?                          All questions were answered, and informed consent  ?                          was obtained. Prior Anticoagulants: The patient has  ?                          taken no previous anticoagulant or antiplatelet  ?                          agents. ASA Grade Assessment: II - A patient with  ?                          mild systemic disease. After reviewing the risks  ?                          and benefits, the patient was deemed in  ?                          satisfactory condition to undergo the procedure. ?  After obtaining informed consent, the colonoscope  ?                          was passed under direct vision. Throughout the  ?                          procedure, the patient's blood pressure, pulse, and  ?                          oxygen saturations were monitored continuously. The  ?                          Olympus PCF-H190DL ([id number]) Colonoscope was  ?                          introduced through the  anus and advanced to the the  ?                          terminal ileum, with identification of the  ?                          appendiceal orifice and IC valve. The colonoscopy  ?                          was performed without difficulty. The patient  ?                          tolerated the procedure well. The quality of the  ?                          bowel preparation was good. The terminal ileum,  ?                          ileocecal valve, appendiceal orifice, and rectum  ?                          were photographed. The bowel preparation used was  ?                          Plenvu via split dose instruction. ?Scope In: 2:34:32 PM ?Scope Out: 2:53:08 PM ?Scope Withdrawal Time: 0 hours 15 minutes 34 seconds  ?Total Procedure Duration: 0 hours 18 minutes 36 seconds  ?Findings:                 The perianal and digital rectal examinations were  ?                          normal. Pertinent negatives include normal  ?                          sphincter tone and no palpable rectal lesions. ?                          A 5 mm polyp was found in the cecum. The polyp was  ?  sessile. The polyp was removed with a cold snare.  ?                          Resection and retrieval were complete. Estimated  ?                          blood loss was minimal. ?                          A 4 mm polyp was found in the transverse colon. The  ?                          polyp was sessile. The polyp was removed with a  ?                          cold snare. Resection and retrieval were complete.  ?                          Estimated blood loss was minimal. ?                          A 3 mm polyp was found in the descending colon. The  ?                          polyp was sessile. The polyp was removed with a  ?                          cold snare. Resection and retrieval were complete.  ?                          Estimated blood loss was minimal. ?                          A 5 mm polyp was found in the sigmoid  colon. The  ?                          polyp was sessile. The polyp was removed with a  ?                          cold snare. Resection and retrieval were complete.  ?                          Estimated blood loss was minimal. ?                          A few small-mouthed diverticula were found in the  ?                          sigmoid colon and descending colon. ?                          The exam was otherwise normal throughout the  ?  examined colon. ?                          The terminal ileum appeared normal. ?                          The retroflexed view of the distal rectum and anal  ?                          verge was normal and showed no anal or rectal  ?                          abnormalities. ?Complications:            No immediate complications. ?Estimated Blood Loss:     Estimated blood loss was minimal. ?Impression:               - One 5 mm polyp in the cecum, removed with a cold  ?                          snare. Resected and retrieved. ?                          - One 4 mm polyp in the transverse colon, removed  ?                          with a cold snare. Resected and retrieved. ?                          - One 3 mm polyp in the descending colon, removed  ?                          with a cold snare. Resected and retrieved. ?                          - One 5 mm polyp in the sigmoid colon, removed with  ?                          a cold snare. Resected and retrieved. ?                          - Diverticulosis in the sigmoid colon and in the  ?                          descending colon. ?                          - The examined portion of the ileum was normal. ?                          - The distal rectum and anal verge are normal on  ?                          retroflexion view. ?Recommendation:           -  Patient has a contact number available for  ?                          emergencies. The signs and symptoms of potential  ?                          delayed  complications were discussed with the  ?                          patient. Return to normal activities tomorrow.  ?                          Written discharge instructions were provided to the  ?                          patient. ?                          - Resume previous diet. ?                          - Continue present medications. ?                          - Await pathology results. ?                          - Repeat colonoscopy (date not yet determined) for  ?                          surveillance based on pathology results. ?Derrick Orris E. Candis Schatz, MD ?03/12/2022 3:06:19 PM ?This report has been signed electronically. ?

## 2022-03-12 NOTE — Patient Instructions (Signed)
Read all of the handouts given to you by your recovery room nurse. ? ?YOU HAD AN ENDOSCOPIC PROCEDURE TODAY AT [facility]:   Refer to the procedure report that was given to you for any specific questions about what was found during the examination.  If the procedure report does not answer your questions, please call your gastroenterologist to clarify.  If you requested that your care partner not be given the details of your procedure findings, then the procedure report has been included in a sealed envelope for you to review at your convenience later. ? ?YOU SHOULD EXPECT: Some feelings of bloating in the abdomen. Passage of more gas than usual.  Walking can help get rid of the air that was put into your GI tract during the procedure and reduce the bloating. If you had a lower endoscopy (such as a colonoscopy or flexible sigmoidoscopy) you may notice spotting of blood in your stool or on the toilet paper. If you underwent a bowel prep for your procedure, you may not have a normal bowel movement for a few days. ? ?Please Note:  You might notice some irritation and congestion in your nose or some drainage.  This is from the oxygen used during your procedure.  There is no need for concern and it should clear up in a day or so. ? ?SYMPTOMS TO REPORT IMMEDIATELY: ? ?Following lower endoscopy (colonoscopy or flexible sigmoidoscopy): ? Excessive amounts of blood in the stool ? Significant tenderness or worsening of abdominal pains ? Swelling of the abdomen that is new, acute ? Fever of 100?F or higher ? ?Following upper endoscopy (EGD) ? Vomiting of blood or coffee ground material ? New chest pain or pain under the shoulder blades ? Painful or persistently difficult swallowing ? New shortness of breath ? Fever of 100?F or higher ? Black, tarry-looking stools ? ?For urgent or emergent issues, a gastroenterologist can be reached at any hour by calling [phone]. ?Do not use MyChart messaging for urgent  concerns.  ? ? ?DIET:  We do recommend a small meal at first, but then you may proceed to your regular diet.  Drink plenty of fluids but you should avoid alcoholic beverages for 24 hours. ? ?ACTIVITY:  You should plan to take it easy for the rest of today and you should NOT DRIVE or use heavy machinery until tomorrow (because of the sedation medicines used during the test).   ? ?FOLLOW UP: ?Our staff will call the number listed on your records 48-72 hours following your procedure to check on you and address any questions or concerns that you may have regarding the information given to you following your procedure. If we do not reach you, we will leave a message.  We will attempt to reach you two times.  During this call, we will ask if you have developed any symptoms of COVID 19. If you develop any symptoms (ie: fever, flu-like symptoms, shortness of breath, cough etc.) before then, please call [phone].  If you test positive for Covid 19 in the 2 weeks post procedure, please call and report this information to [place].   ? ?If any biopsies were taken you will be contacted by phone or by letter within the next 1-3 weeks.  Please call us at [phone] if you have not heard about the biopsies in 3 weeks.  ? ? ?SIGNATURES/CONFIDENTIALITY: ?You and/or your care partner have signed paperwork which will be entered into your electronic medical record.  These signatures attest to the fact that that the information above on your After Visit Summary has been reviewed and is understood.  Full responsibility of the confidentiality of this discharge information lies with you and/or your care-partner.  ?

## 2022-03-12 NOTE — Progress Notes (Signed)
To Pacu, VSS. Report to [email or web address] 

## 2022-03-14 ENCOUNTER — Telehealth: Payer: Self-pay | Admitting: *Deleted

## 2022-03-14 ENCOUNTER — Telehealth: Payer: Self-pay

## 2022-03-14 NOTE — Telephone Encounter (Signed)
Patient returned call, stated he is doing well.  ?

## 2022-03-14 NOTE — Telephone Encounter (Signed)
?  Follow up Call- ? ? ?  03/12/2022  ?  1:58 PM  ?Call back number  ?Post procedure Call Back phone  # [phone]  ?Permission to leave phone message Yes  ?  ? ?Patient questions: ? ?Mailbox is full. ?

## 2022-03-14 NOTE — Telephone Encounter (Signed)
Attempted to reach patient for post-procedure f/u call. No answer. Unable to leave message as mailbox is full. 

## 2022-03-17 NOTE — Progress Notes (Signed)
Mr. Carl Greene, ?The biopsies of your esophagus were normal.  There was no evidence of eosinophilic esophagitis.  The narrowing in your esophagus was most likely related to acid reflux.  I recommend you continue the omeprazole to reduce the risk of recurrent/worsening narrowing of the esophagus. ? ? ?The four polyps that I removed during your recent procedure were completely benign but were proven to be "pre-cancerous" polyps that MAY have grown into cancers if they had not been removed.  Studies shows that at least 20% of women over age 25 and 30% of men over age 14 have pre-cancerous polyps.  Based on current nationally recognized surveillance guidelines, I recommend that you have a repeat colonoscopy in 5 years.  ? ?If you develop any new rectal bleeding, abdominal pain or significant bowel habit changes, please contact me before then. ? ?

## 2022-03-19 ENCOUNTER — Other Ambulatory Visit: Payer: Self-pay | Admitting: Family

## 2022-03-19 DIAGNOSIS — I10 Essential (primary) hypertension: Secondary | ICD-10-CM

## 2022-03-19 MED ORDER — AMLODIPINE BESYLATE 10 MG PO TABS: 10.0000 mg | ORAL_TABLET | Freq: Every day | ORAL | 0 refills | Status: DC

## 2022-03-19 MED ORDER — VALSARTAN 40 MG PO TABS: 40.0000 mg | ORAL_TABLET | Freq: Every day | ORAL | 0 refills | Status: DC

## 2022-03-24 ENCOUNTER — Other Ambulatory Visit: Payer: Self-pay | Admitting: Family

## 2022-03-24 DIAGNOSIS — I10 Essential (primary) hypertension: Secondary | ICD-10-CM

## 2022-03-24 DIAGNOSIS — K219 Gastro-esophageal reflux disease without esophagitis: Secondary | ICD-10-CM

## 2022-04-03 ENCOUNTER — Encounter: Payer: Self-pay | Admitting: Family

## 2022-04-03 ENCOUNTER — Other Ambulatory Visit: Payer: Self-pay | Admitting: Family

## 2022-04-03 DIAGNOSIS — I10 Essential (primary) hypertension: Secondary | ICD-10-CM

## 2022-04-03 DIAGNOSIS — K219 Gastro-esophageal reflux disease without esophagitis: Secondary | ICD-10-CM

## 2022-05-25 IMAGING — DX DG SACRUM/COCCYX 2+V
3 series · 3 of 3 positions shown · non-contrast
Comparison: None.

CLINICAL DATA: Tailbone pain

EXAM:
SACRUM AND COCCYX - 2 VIEW

[sacrum 20° caudo-cranial ap]
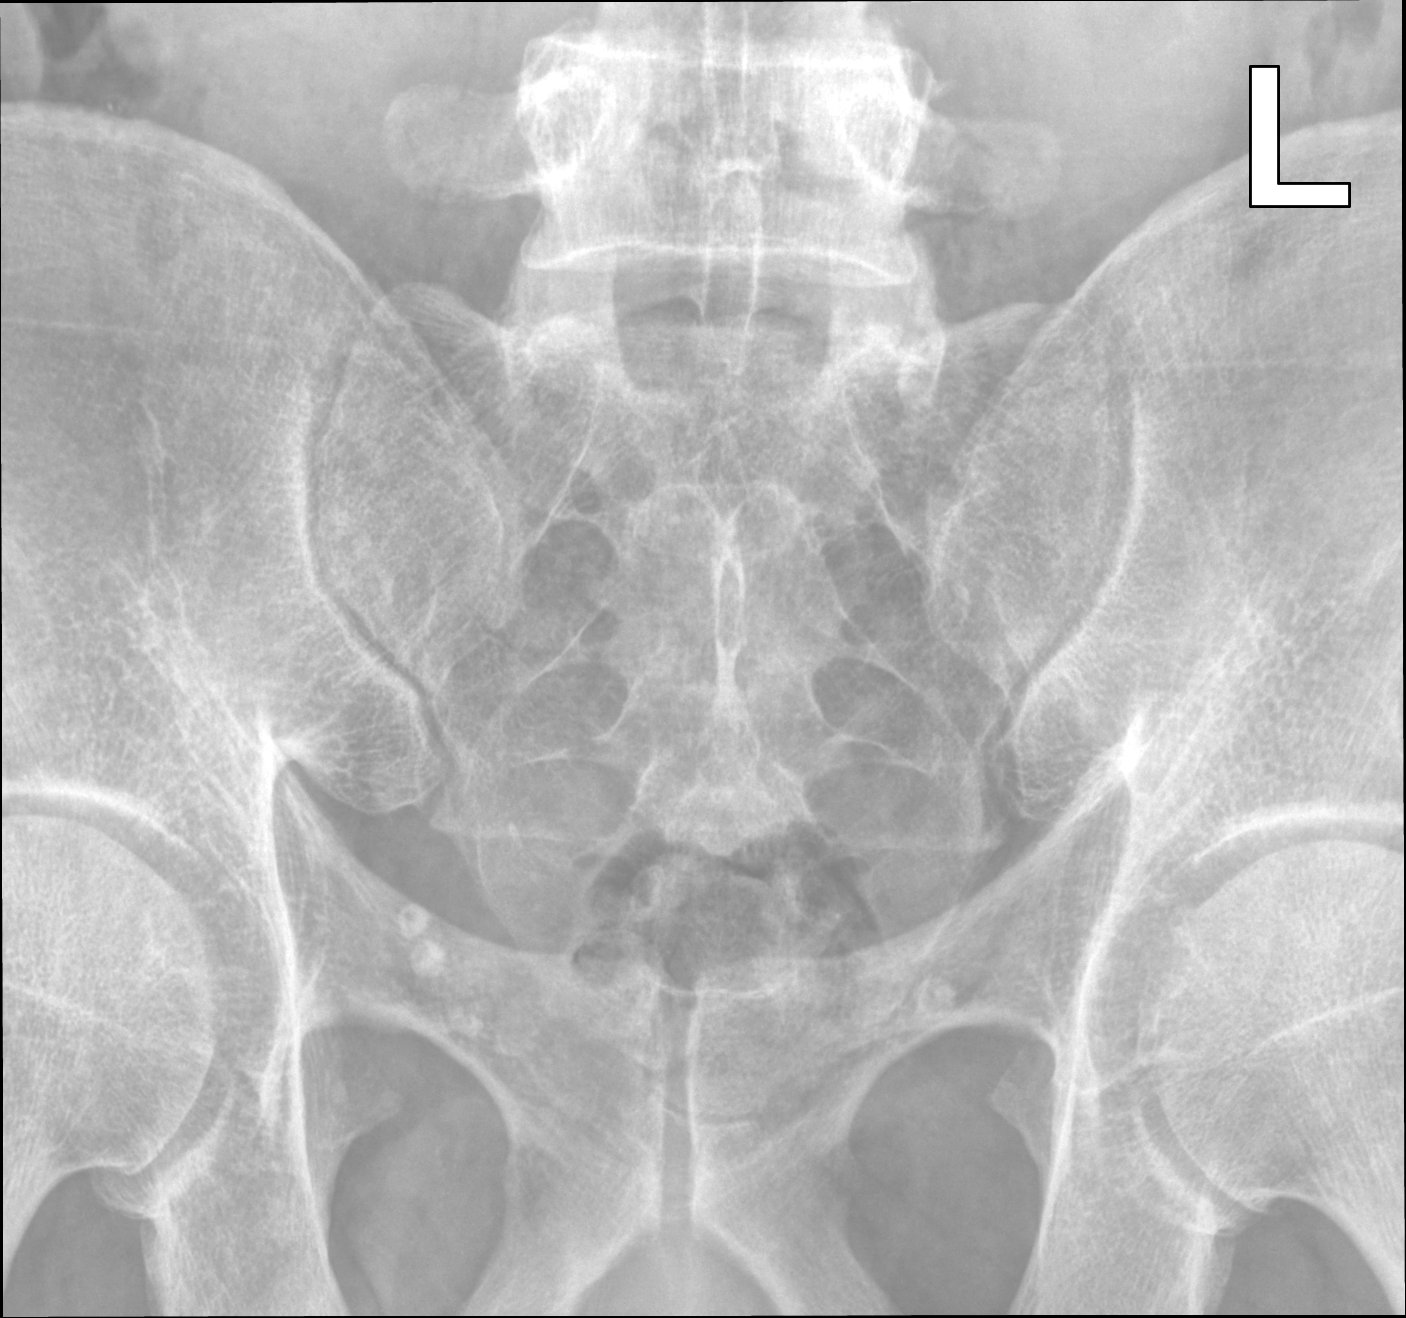

[coccyx 20° cranio-caudal ap]
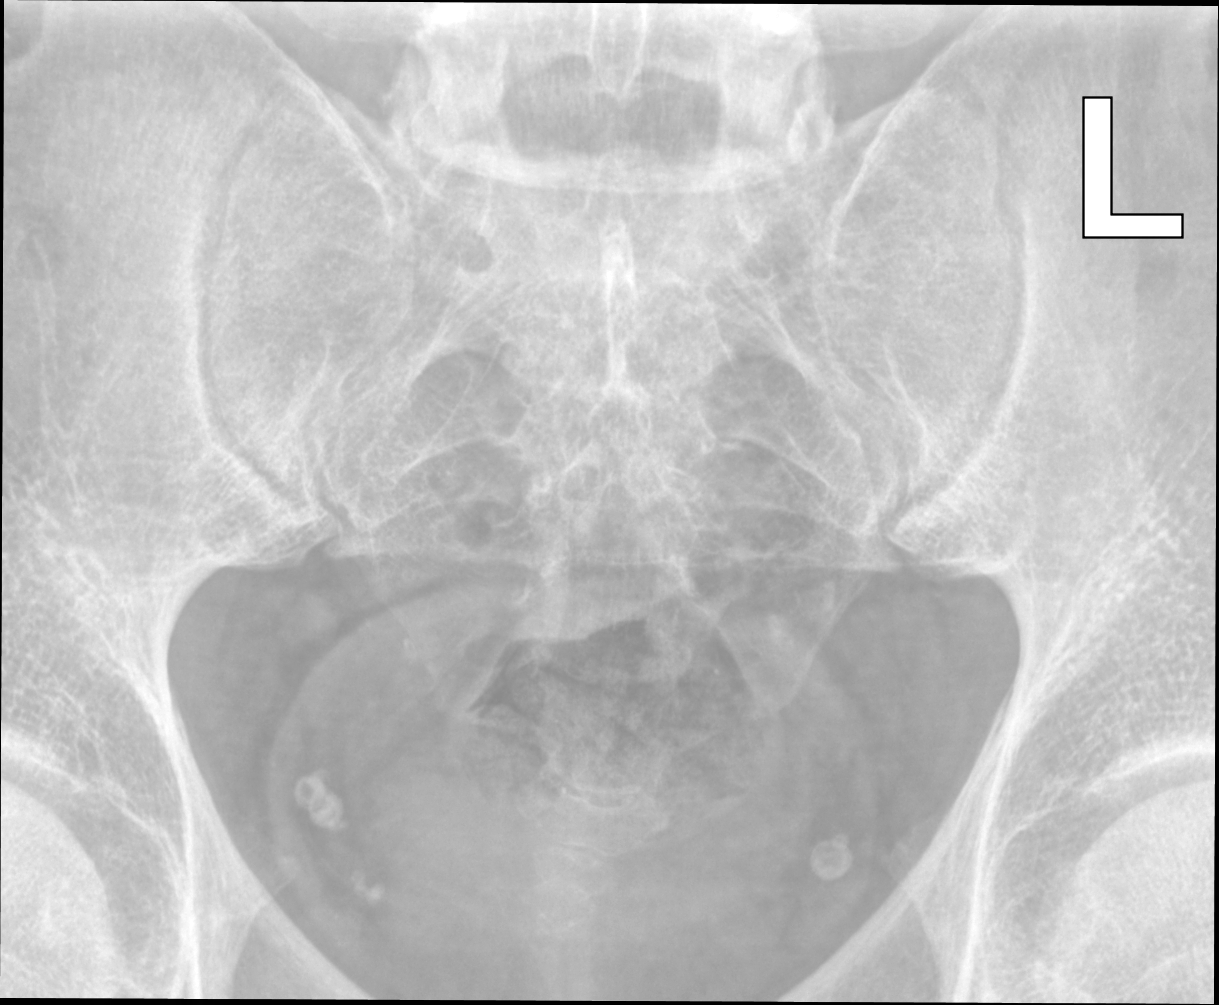

[sacrum lat]
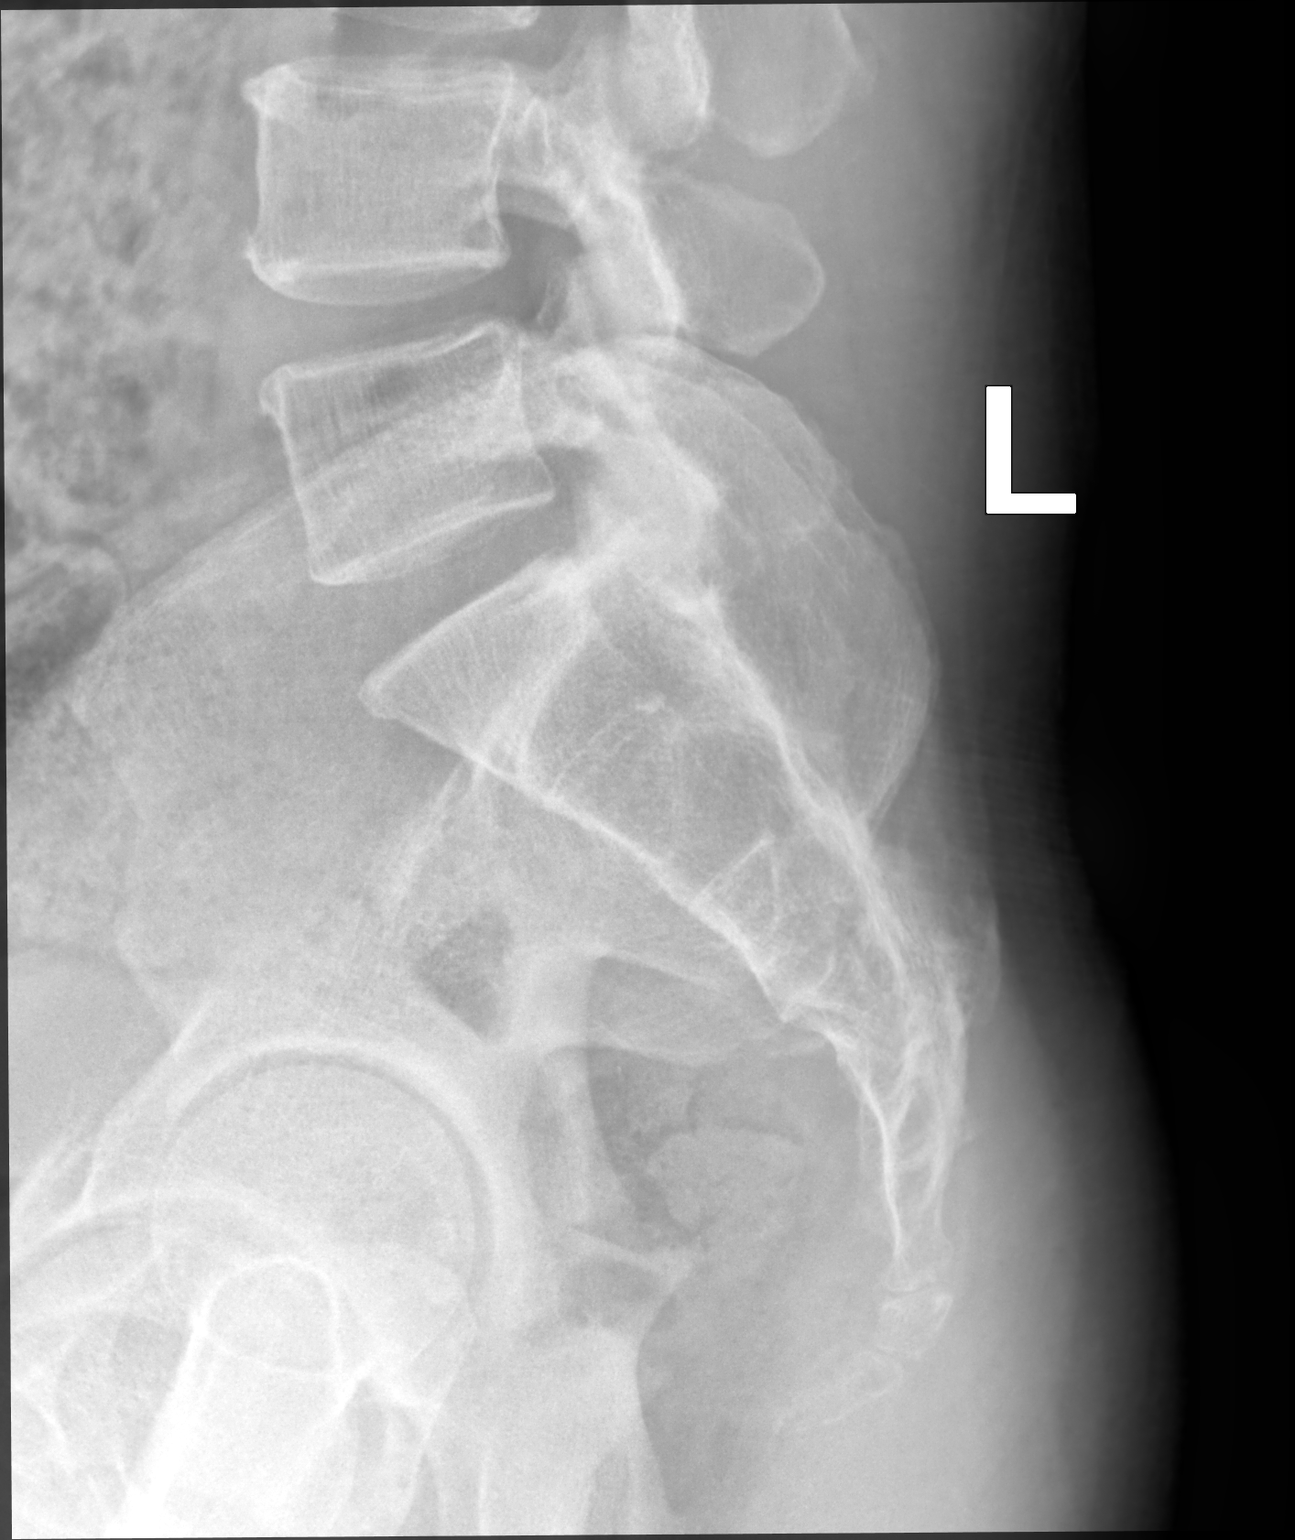

[3 of 3 positions shown; findings below may reference images not displayed]

FINDINGS: There is no evidence of fracture or other focal bone lesions.
Calcifications in the pelvis, likely due to phleboliths. Bilateral
SI joints and pubic symphysis are unremarkable with no significant
degenerative changes.
IMPRESSION: No acute osseous abnormalities.

## 2022-07-11 ENCOUNTER — Other Ambulatory Visit: Payer: Self-pay | Admitting: Family

## 2022-07-11 DIAGNOSIS — K219 Gastro-esophageal reflux disease without esophagitis: Secondary | ICD-10-CM

## 2022-07-11 DIAGNOSIS — I10 Essential (primary) hypertension: Secondary | ICD-10-CM

## 2022-07-12 ENCOUNTER — Other Ambulatory Visit: Payer: Self-pay | Admitting: Family

## 2022-07-12 DIAGNOSIS — I10 Essential (primary) hypertension: Secondary | ICD-10-CM

## 2022-07-12 DIAGNOSIS — K219 Gastro-esophageal reflux disease without esophagitis: Secondary | ICD-10-CM

## 2022-07-24 ENCOUNTER — Other Ambulatory Visit: Payer: Self-pay | Admitting: Family

## 2022-07-24 DIAGNOSIS — I10 Essential (primary) hypertension: Secondary | ICD-10-CM

## 2022-07-26 ENCOUNTER — Ambulatory Visit
Admission: EM | Admit: 2022-07-26 | Discharge: 2022-07-26 | Disposition: A | Discharge status: Home / Self Care | Payer: Managed Care, Other (non HMO) | Attending: Internal Medicine | Admitting: Internal Medicine

## 2022-07-26 ENCOUNTER — Encounter: Payer: Self-pay | Admitting: Emergency Medicine

## 2022-07-26 DIAGNOSIS — L089 Local infection of the skin and subcutaneous tissue, unspecified: Secondary | ICD-10-CM | POA: Diagnosis not present

## 2022-07-26 MED ORDER — CEPHALEXIN 500 MG PO CAPS: 500.0000 mg | ORAL_CAPSULE | Freq: Four times a day (QID) | ORAL | 0 refills | Status: DC

## 2022-07-26 NOTE — ED Provider Notes (Signed)
[name]    CSN: [id number] Arrival date & time: 07/26/22  0959      History   Chief Complaint Chief Complaint  Patient presents with   Foot Pain    HPI Carl Greene is a 46 y.o. male.   Patient presents with sore in between second and third toe on right foot.  Patient states that he noticed this yesterday.  Denies history of diabetes.  Denies any injury to the area.  Denies any numbness or tingling.  Patient is able to bear weight.  Patient reports he has noticed some purulent drainage from the area.  Denies fever, body aches, chills.   Foot Pain    Past Medical History:  Diagnosis Date   Chest pain    Echo EF 50-55%. [facility] study 4/11: walked 9:15 no ecg changes. EF 42%?  inferoir attenuation no scar.    Dyspnea    GERD (gastroesophageal reflux disease)    HTN (hypertension)    Hx of gastric ulcer    Lightheadedness    with CP    Patient Active Problem List   Diagnosis Date Noted   Goiter 04/23/2011   Tobacco use disorder 04/23/2011   HYPERTENSION, BENIGN ESSENTIAL 03/07/2010   DYSPNEA 03/07/2010   CHEST PAIN-UNSPECIFIED 03/07/2010    Past Surgical History:  Procedure Laterality Date   FRACTURE SURGERY     MANDIBLE FRACTURE SURGERY         Home Medications    Prior to Admission medications   Medication Sig Start Date End Date Taking? Authorizing Provider  cephALEXin (KEFLEX) 500 MG capsule Take 1 capsule (500 mg total) by mouth 4 (four) times daily. 07/26/22  Yes Derwood Becraft, Hildred Alamin E, FNP  amLODipine (NORVASC) 10 MG tablet Take 1 tablet (10 mg total) by mouth daily. 03/19/22 06/17/22  Camillia Herter, NP  cyclobenzaprine (FLEXERIL) 5 MG tablet Take 1 tablet (5 mg total) by mouth 2 (two) times daily as needed for muscle spasms. 03/07/22   Teodora Medici, FNP  omeprazole (PRILOSEC) 20 MG capsule Take 1 capsule (20 mg total) by mouth daily. 03/12/22   Camillia Herter, NP  valsartan (DIOVAN) 40 MG tablet Take 1 tablet (40 mg total) by mouth daily.  03/19/22 06/17/22  Camillia Herter, NP    Family History Family History  Problem Relation Age of Onset   Hypertension Mother    Colon polyps Maternal Uncle    Coronary artery disease Maternal Grandfather    Diabetes Mellitus I Other    Colon cancer Neg Hx    Esophageal cancer Neg Hx    Pancreatic cancer Neg Hx    Stomach cancer Neg Hx     Social History Social History   Tobacco Use   Smoking status: Former    Packs/day: 0.50    Types: Cigarettes    Quit date: 03/12/2013    Years since quitting: 9.3   Smokeless tobacco: Never  Vaping Use   Vaping Use: Never used  Substance Use Topics   Alcohol use: No   Drug use: Yes    Types: Marijuana     Allergies   Patient has no known allergies.   Review of Systems Review of Systems Per HPI  Physical Exam Triage Vital Signs ED Triage Vitals [07/26/22 1037]  Enc Vitals Group     BP (!) 149/89     Pulse Rate 63     Resp 18     Temp 98.1 F (36.7 C)  Temp src      SpO2 98 %     Weight      Height      Head Circumference      Peak Flow      Pain Score 5     Pain Loc      Pain Edu?      Excl. in [place]?    No data found.  Updated Vital Signs BP (!) 149/89   Pulse 63   Temp 98.1 F (36.7 C)   Resp 18   SpO2 98%   Visual Acuity Right Eye Distance:   Left Eye Distance:   Bilateral Distance:    Right Eye Near:   Left Eye Near:    Bilateral Near:     Physical Exam Constitutional:      General: He is not in acute distress.    Appearance: Normal appearance. He is not toxic-appearing or diaphoretic.  HENT:     Head: Normocephalic and atraumatic.  Eyes:     Extraocular Movements: Extraocular movements intact.     Conjunctiva/sclera: Conjunctivae normal.  Pulmonary:     Effort: Pulmonary effort is normal.  Feet:     Comments: Approximately 1.5 cm in diameter sore like lesion with some mild purulent drainage noted to medial third toe.  Patient has full range of motion of toes and foot.  Capillary refill and  pulses normal. Neurological:     General: No focal deficit present.     Mental Status: He is alert and oriented to person, place, and time. Mental status is at baseline.  Psychiatric:        Mood and Affect: Mood normal.        Behavior: Behavior normal.        Thought Content: Thought content normal.        Judgment: Judgment normal.      UC Treatments / Results  Labs (all labs ordered are listed, but only abnormal results are displayed) Labs Reviewed - No data to display  EKG   Radiology No results found.  Procedures Procedures (including critical care time)  Medications Ordered in UC Medications - No data to display  Initial Impression / Assessment and Plan / UC Course  I have reviewed the triage vital signs and the nursing notes.  Pertinent labs & imaging results that were available during my care of the patient were reviewed by me and considered in my medical decision making (see chart for details).     Toe appears infected.  Will treat with cephalexin antibiotic.  Nonadherent dressing applied prior to discharge.  Patient advised to follow-up with podiatry tomorrow to schedule appointment for further evaluation and management.  Patient advised to monitor very closely for any worsening infection or symptoms.  No concern for bone infection and given no injury will defer imaging. discussed return and ER precautions.  Patient verbalized understanding and was agreeable with plan. Final Clinical Impressions(s) / UC Diagnoses   Final diagnoses:  Toe infection     Discharge Instructions      You have an infection in between your toes.  This is being treated with an antibiotic.  Please follow-up with podiatry at provided contact information tomorrow to schedule appointment for further evaluation and management.    ED Prescriptions     Medication Sig Dispense Auth. Provider   cephALEXin (KEFLEX) 500 MG capsule Take 1 capsule (500 mg total) by mouth 4 (four) times  daily. 28 capsule [place], Michele Rockers, [place]  PDMP not reviewed this encounter.   Teodora Medici, [place] 07/26/22 1109

## 2022-07-26 NOTE — Progress Notes (Signed)
Erroneous encounter-disregard

## 2022-07-26 NOTE — Discharge Instructions (Addendum)
You have an infection in between your toes.  This is being treated with an antibiotic.  Please follow-up with podiatry at provided contact information tomorrow to schedule appointment for further evaluation and management.

## 2022-07-26 NOTE — ED Triage Notes (Signed)
Pt is present today with a sore in between his toes on the right foot. Pt states that he also noticed swelling yesterday.

## 2022-07-27 DIAGNOSIS — L97509 Non-pressure chronic ulcer of other part of unspecified foot with unspecified severity: Secondary | ICD-10-CM | POA: Insufficient documentation

## 2022-07-27 DIAGNOSIS — L84 Corns and callosities: Secondary | ICD-10-CM | POA: Insufficient documentation

## 2022-07-31 ENCOUNTER — Ambulatory Visit: Payer: Self-pay | Admitting: Podiatry

## 2022-08-08 ENCOUNTER — Encounter: Payer: Managed Care, Other (non HMO) | Admitting: Family

## 2022-08-08 ENCOUNTER — Ambulatory Visit: Payer: Self-pay

## 2022-08-08 DIAGNOSIS — K219 Gastro-esophageal reflux disease without esophagitis: Secondary | ICD-10-CM

## 2022-08-08 DIAGNOSIS — I10 Essential (primary) hypertension: Secondary | ICD-10-CM

## 2022-08-08 NOTE — Telephone Encounter (Signed)
Provider aware

## 2022-08-08 NOTE — Telephone Encounter (Signed)
  Chief Complaint: HTN Symptoms: HA and neck pain, BP 168/122 Frequency: yesterday but been elevated for several days  Pertinent Negatives: NA Disposition: '[]'$ ED /'[]'$ Urgent Care (no appt availability in office) / '[]'$ Appointment(In office/virtual)/ '[]'$  [place] [facility]/ '[]'$ Home Care/ '[]'$ Refused Recommended Disposition /'[x]'$ [place] [facility]/ '[]'$  Follow-up with PCP Additional Notes: pt has been out of BP meds for 1 month or more and had appt today at 1440 with PCP but pt got off work late and missed appt for med refills. Pt advised no appts again until 08/15/22 and recommended he go to [facility]. Pt unable to go today so gave detail location information for tomorrow. Advised pt I would send Mychart message with details as well.   Reason for Disposition  Systolic BP  >= 537 OR Diastolic >= 482  Answer Assessment - Initial Assessment Questions 1. BLOOD PRESSURE: "What is the blood pressure?" "Did you take at least two measurements 5 minutes apart?"     168/22 2. ONSET: "When did you take your blood pressure?"     yesterday 3. HOW: "How did you take your blood pressure?" (e.g., automatic home BP monitor, visiting nurse)     home 4. HISTORY: "Do you have a history of high blood pressure?"     yes 5. MEDICINES: "Are you taking any medicines for blood pressure?" "Have you missed any doses recently?"     Yes and been out for 1 month or more  6. OTHER SYMPTOMS: "Do you have any symptoms?" (e.g., blurred vision, chest pain, difficulty breathing, headache, weakness)     Neck pain and HA  Protocols used: Blood Pressure - High-A-AH

## 2022-08-19 ENCOUNTER — Other Ambulatory Visit: Payer: Self-pay

## 2022-08-19 ENCOUNTER — Emergency Department (HOSPITAL_COMMUNITY)
Admission: EM | Admit: 2022-08-19 | Discharge: 2022-08-20 | Disposition: A | Discharge status: Home / Self Care | Payer: Managed Care, Other (non HMO) | Attending: Student | Admitting: Student

## 2022-08-19 ENCOUNTER — Emergency Department (HOSPITAL_COMMUNITY): Payer: Managed Care, Other (non HMO)

## 2022-08-19 DIAGNOSIS — M5442 Lumbago with sciatica, left side: Secondary | ICD-10-CM | POA: Diagnosis present

## 2022-08-19 DIAGNOSIS — G8929 Other chronic pain: Secondary | ICD-10-CM | POA: Diagnosis not present

## 2022-08-19 DIAGNOSIS — M5441 Lumbago with sciatica, right side: Secondary | ICD-10-CM | POA: Diagnosis not present

## 2022-08-19 DIAGNOSIS — I10 Essential (primary) hypertension: Secondary | ICD-10-CM | POA: Diagnosis not present

## 2022-08-19 DIAGNOSIS — Z79899 Other long term (current) drug therapy: Secondary | ICD-10-CM | POA: Diagnosis not present

## 2022-08-19 NOTE — ED Provider Triage Note (Signed)
Emergency Medicine Provider Triage Evaluation Note  Carl Greene , a 46 y.o. male  was evaluated in triage.  Pt complains of sinus static pain.  Denies any recent trauma or injury to the back.  Going on for about a week, raises his leg.  Sometimes it switches sides, denies dysuria, hematuria, saddle anesthesia, bilateral lower extremity weakness or numbness history malignancy or previous surgeries.  Review of Systems  Per HPI  Physical Exam  BP (!) 145/94   Pulse 71   Temp 98.6 F (37 C)   Resp 19   SpO2 99%  Gen:   Awake, no distress   Resp:  Normal effort  MSK:   Moves extremities without difficulty  Other:  Cranial nerves II through XII grossly intact.  Upper and lower extremity strength is symmetric bilaterally.  Normal finger-nose, no pronator drift.  Medical Decision Making  Medically screening exam initiated at 8:08 PM.  Appropriate orders placed.  Carl Greene was informed that the remainder of the evaluation will be completed by another provider, this initial triage assessment does not replace that evaluation, and the importance of remaining in the ED until their evaluation is complete.     Sherrill Raring, PA-C 08/19/22 2009

## 2022-08-19 NOTE — ED Triage Notes (Signed)
Pt here from home for sciatic nerve pain on the R side that he says has been going on for a week. Pt reports hx of the same, states pain intermittently changes between sides. He states this time pain is much worse, unable to sit on R side.

## 2022-08-20 ENCOUNTER — Ambulatory Visit: Payer: Self-pay

## 2022-08-20 MED ORDER — LIDOCAINE 5 % EX PTCH: 1.0000 | MEDICATED_PATCH | CUTANEOUS | 0 refills | Status: AC

## 2022-08-20 MED ORDER — LIDOCAINE 5 % EX PTCH
1.0000 | MEDICATED_PATCH | Freq: Once | CUTANEOUS | Status: DC
  Filled 2022-08-20: qty 1

## 2022-08-20 MED ORDER — HYDROCODONE-ACETAMINOPHEN 5-325 MG PO TABS: 1.0000 | ORAL_TABLET | Freq: Once | ORAL | Status: DC

## 2022-08-20 MED ORDER — METHOCARBAMOL 500 MG PO TABS
500.0000 mg | ORAL_TABLET | Freq: Two times a day (BID) | ORAL | 0 refills | Status: DC
Start: 2022-08-20 — End: 2022-11-25

## 2022-08-20 MED ORDER — METHYLPREDNISOLONE 4 MG PO TBPK: ORAL_TABLET | ORAL | 0 refills | Status: DC

## 2022-08-20 NOTE — ED Provider Notes (Signed)
[facility] EMERGENCY DEPARTMENT Provider Note   CSN: [id number] Arrival date & time: 08/19/22  1914     History  Chief Complaint  Patient presents with   sciatic pain    Carl Greene is a 46 y.o. male.  Patient with history of hypertension presents today with complaints of back pain.  He states that same has been intermittent in nature over the past year.  Denies any specific injury but does state that he does a lot of heavy lifting at his job.  States that pain is in his low back and radiates down both his legs.  He is able to walk but with pain.  Denies fevers, chills, loss of bowel or bladder function, or saddle paresthesias.  He was seen at urgent care in April 2023 for similar symptoms and had unremarkable x-ray imaging and was given steroid taper and muscle relaxer which he states did give him some relief.  Presents today for same.  He denies any history of malignancy, long-term steroid use, or IVDU. No dysuria or hematuria.  The history is provided by the patient. No language interpreter was used.       Home Medications Prior to Admission medications   Medication Sig Start Date End Date Taking? Authorizing Provider  amLODipine (NORVASC) 10 MG tablet Take 1 tablet (10 mg total) by mouth daily. 03/19/22 06/17/22  Camillia Herter, NP  cephALEXin (KEFLEX) 500 MG capsule Take 1 capsule (500 mg total) by mouth 4 (four) times daily. 07/26/22   Teodora Medici, FNP  cyclobenzaprine (FLEXERIL) 5 MG tablet Take 1 tablet (5 mg total) by mouth 2 (two) times daily as needed for muscle spasms. 03/07/22   Teodora Medici, FNP  omeprazole (PRILOSEC) 20 MG capsule Take 1 capsule (20 mg total) by mouth daily. 03/12/22   Camillia Herter, NP  valsartan (DIOVAN) 40 MG tablet Take 1 tablet (40 mg total) by mouth daily. 03/19/22 06/17/22  Camillia Herter, NP      Allergies    Patient has no known allergies.    Review of Systems   Review of Systems  Musculoskeletal:  Positive for back  pain.  All other systems reviewed and are negative.   Physical Exam Updated Vital Signs BP (!) 149/102 (BP Location: Left Arm)   Pulse 64   Temp 97.7 F (36.5 C)   Resp 19   SpO2 100%  Physical Exam Vitals and nursing note reviewed.  Constitutional:      General: He is not in acute distress.    Appearance: Normal appearance. He is normal weight. He is not ill-appearing, toxic-appearing or diaphoretic.  HENT:     Head: Normocephalic and atraumatic.  Cardiovascular:     Rate and Rhythm: Normal rate.  Pulmonary:     Effort: Pulmonary effort is normal. No respiratory distress.  Musculoskeletal:        General: Normal range of motion.     Cervical back: Normal range of motion.     Comments: No cervical or thoracic midline spinal tenderness.  Tenderness noted to palpation of the midline lumbar spine as well as bilateral paraspinous muscles. No step-offs, deformity, or overlying skin changes.  Patient is ambulatory with pain. +SLR bilaterally. 5/5 strength and sensation intact bilaterally.  DP and PT pulses intact and 2+.  Skin:    General: Skin is warm and dry.  Neurological:     General: No focal deficit present.     Mental Status: He is  alert.  Psychiatric:        Mood and Affect: Mood normal.        Behavior: Behavior normal.     ED Results / Procedures / Treatments   Labs (all labs ordered are listed, but only abnormal results are displayed) Labs Reviewed - No data to display  EKG None  Radiology DG Lumbar Spine Complete  Result Date: 08/19/2022 CLINICAL DATA:  Back pain, radicular pain in lower extremities EXAM: LUMBAR SPINE - COMPLETE 4+ VIEW COMPARISON:  03/07/2022 FINDINGS: No recent fracture is seen. There is no significant disc space narrowing. Small anterior bony spurs seen. Degenerative changes are noted in facet joints, more so at L5-S1 level. IMPRESSION: No recent fracture is seen. Degenerative changes are noted with small bony spurs and facet hypertrophy.  There is no significant disc space narrowing. No significant interval changes are noted. Electronically Signed   By: Elmer Picker M.D.   On: 08/19/2022 20:43    Procedures Procedures    Medications Ordered in ED Medications - No data to display  ED Course/ Medical Decision Making/ A&P                           Medical Decision Making  Patient presents today with intermittent back pain x1 year.  He is afebrile, nontoxic-appearing, and in no acute distress with reassuring vital signs.  Physical exam reveals no neurological deficits and normal neuro exam.  Patient can walk with steady gait with some pain.  No loss of bowel or bladder control or saddle paresthesias.  No concern for cauda equina.  No fever, night sweats, weight loss, h/o cancer, IVDU.  X-ray imaging unremarkable for acute findings.  I have personally reviewed and interpreted this imaging and agree with radiology interpretation.  No dysuria or hematuria. We will give a Medrol Dosepak and muscle relaxer as patient states that this did give him relief previously.  Also given lidocaine patch.  Will give neurosurgery referral for continued evaluation and management per the patient's request.  Patient is stable for discharge, educated on red flag symptoms of prompt immediate return.  Patient is understanding and amenable with plan, discharged in stable condition.   Final Clinical Impression(s) / ED Diagnoses Final diagnoses:  Chronic bilateral low back pain with bilateral sciatica    Rx / DC Orders ED Discharge Orders          Ordered    methylPREDNISolone (MEDROL DOSEPAK) 4 MG TBPK tablet        08/20/22 0842    methocarbamol (ROBAXIN) 500 MG tablet  2 times daily        08/20/22 0842    lidocaine (LIDODERM) 5 %  Every 24 hours        08/20/22 5277          An After Visit Summary was printed and given to the patient.     Nestor Lewandowsky 08/20/22 0845    Elgie Congo, MD 08/20/22 [phone]

## 2022-08-20 NOTE — Telephone Encounter (Signed)
  Chief Complaint: severe lower back pain that radiates to both legs right greater than left Symptoms: constant severe pain and sciatica Frequency: 1 week Pertinent Negatives: Patient denies weakness, numbness, or problems with bowel/bladder control Disposition: '[]'$ ED /'[x]'$ Urgent Care (no appt availability in office) / '[]'$ Appointment(In office/virtual)/ '[]'$  [place] [facility]/ '[]'$ Home Care/ '[]'$ Refused Recommended Disposition /'[]'$ [place] [facility]/ '[]'$  Follow-up with PCP Additional Notes: Hospital f/u visit appt made- put pt on wait list. Reason for Disposition  [1] Pain radiates into the thigh or further down the leg AND [2] both legs  Answer Assessment - Initial Assessment Questions 1. ONSET: "When did the pain begin?"      1 week 2. LOCATION: "Where does it hurt?" (upper, mid or lower back)     Across lower back down right buttock then to right leg and then to left knee  right greater than left 3. SEVERITY: "How bad is the pain?"  (e.g., Scale 1-10; mild, moderate, or severe)   - MILD (1-3): Doesn't interfere with normal activities.    - MODERATE (4-7): Interferes with normal activities or awakens from sleep.    - SEVERE (8-10): Excruciating pain, unable to do any normal activities.      severe 4. PATTERN: "Is the pain constant?" (e.g., yes, no; constant, intermittent)      constant 5. RADIATION: "Does the pain shoot into your legs or somewhere else?"     yes 6. CAUSE:  "What do you think is causing the back pain?"      Work ? 7. BACK OVERUSE:  "Any recent lifting of heavy objects, strenuous work or exercise?"     Work related 8. MEDICINES: "What have you taken so far for the pain?" (e.g., nothing, acetaminophen, NSAIDS)     Gabapentin lidocaine robaxin and prednisone, warm pad 9. NEUROLOGIC SYMPTOMS: "Do you have any weakness, numbness, or problems with bowel/bladder control?"     no 10. OTHER SYMPTOMS: "Do you have any other symptoms?" (e.g., fever, abdomen pain, burning with  urination, blood in urine)       no 11. PREGNANCY: "Is there any chance you are pregnant?" "When was your last menstrual period?"       N/a  Protocols used: Back Pain-A-AH

## 2022-08-20 NOTE — Discharge Instructions (Addendum)
As we discussed, x-ray imaging of your back did not show any emergent concerns.  I have given you a prescription for 3 medications for you to take as prescribed for management of your symptoms.  Do not drive or operate heavy machinery after taking Robaxin as it can be sedating.  You may also take Tylenol/ibuprofen as needed for additional pain relief.  I have also given you a referral to a specialist with a number to call to schedule an appointment for continued evaluation and management of your symptoms.  Return if development of any new or worsening symptoms.

## 2022-08-22 DIAGNOSIS — M545 Low back pain, unspecified: Secondary | ICD-10-CM | POA: Insufficient documentation

## 2022-09-03 DIAGNOSIS — S39012A Strain of muscle, fascia and tendon of lower back, initial encounter: Secondary | ICD-10-CM | POA: Insufficient documentation

## 2022-09-03 DIAGNOSIS — M7918 Myalgia, other site: Secondary | ICD-10-CM | POA: Insufficient documentation

## 2022-09-18 NOTE — Progress Notes (Deleted)
Patient ID: Carl Greene, male    DOB: July 07, 1976  MRN: [id number]  CC: Emergency Department Follow-Up  Subjective: Carl Greene is a 46 y.o. male who presents for emergency department follow-up.   His concerns today include:  08/19/2022 - 08/20/2022 [facility] Emergency Department per MD note: Medical Decision Making Patient presents today with intermittent back pain x1 year.  He is afebrile, nontoxic-appearing, and in no acute distress with reassuring vital signs.  Physical exam reveals no neurological deficits and normal neuro exam.  Patient can walk with steady gait with some pain.  No loss of bowel or bladder control or saddle paresthesias.  No concern for cauda equina.  No fever, night sweats, weight loss, h/o cancer, IVDU.  X-ray imaging unremarkable for acute findings.  I have personally reviewed and interpreted this imaging and agree with radiology interpretation.  No dysuria or hematuria. We will give a Medrol Dosepak and muscle relaxer as patient states that this did give him relief previously.  Also given lidocaine patch.  Will give neurosurgery referral for continued evaluation and management per the patient's request.  Patient is stable for discharge, educated on red flag symptoms of prompt immediate reurn.  Patient is understanding and amenable with plan, discharged in stable condition.t   Follow-Ups: Call Judith Part, MD (Neurosurgery)  Today's visit 09/28/2022: Still taking Valsartan?   Patient Active Problem List   Diagnosis Date Noted   Goiter 04/23/2011   Tobacco use disorder 04/23/2011   HYPERTENSION, BENIGN ESSENTIAL 03/07/2010   DYSPNEA 03/07/2010   CHEST PAIN-UNSPECIFIED 03/07/2010     Current Outpatient Medications on File Prior to Visit  Medication Sig Dispense Refill   amLODipine (NORVASC) 10 MG tablet Take 1 tablet (10 mg total) by mouth daily. 90 tablet 0   cephALEXin (KEFLEX) 500 MG capsule Take 1 capsule (500 mg total) by mouth 4  (four) times daily. 28 capsule 0   cyclobenzaprine (FLEXERIL) 5 MG tablet Take 1 tablet (5 mg total) by mouth 2 (two) times daily as needed for muscle spasms. 20 tablet 0   lidocaine (LIDODERM) 5 % Place 1 patch onto the skin daily. Remove & Discard patch within 12 hours or as directed by MD 30 patch 0   methocarbamol (ROBAXIN) 500 MG tablet Take 1 tablet (500 mg total) by mouth 2 (two) times daily. 20 tablet 0   methylPREDNISolone (MEDROL DOSEPAK) 4 MG TBPK tablet Take as directed on package 1 each 0   omeprazole (PRILOSEC) 20 MG capsule Take 1 capsule (20 mg total) by mouth daily. 120 capsule 0   valsartan (DIOVAN) 40 MG tablet Take 1 tablet (40 mg total) by mouth daily. 90 tablet 0   No current facility-administered medications on file prior to visit.    No Known Allergies  Social History   Socioeconomic History   Marital status: Single    Spouse name: Not on file   Number of children: Not on file   Years of education: Not on file   Highest education level: Not on file  Occupational History   Not on file  Tobacco Use   Smoking status: Former    Packs/day: 0.50    Types: Cigarettes    Quit date: 03/12/2013    Years since quitting: 9.5   Smokeless tobacco: Never  Vaping Use   Vaping Use: Never used  Substance and Sexual Activity   Alcohol use: No   Drug use: Yes    Types: Marijuana   Sexual activity:  Not on file  Other Topics Concern   Not on file  Social History Narrative   Not on file   Social Determinants of Health   Financial Resource Strain: Not on file  Food Insecurity: Not on file  Transportation Needs: Not on file  Physical Activity: Not on file  Stress: Not on file  Social Connections: Not on file  Intimate Partner Violence: Not on file    Family History  Problem Relation Age of Onset   Hypertension Mother    Colon polyps Maternal Uncle    Coronary artery disease Maternal Grandfather    Diabetes Mellitus I Other    Colon cancer Neg Hx    Esophageal  cancer Neg Hx    Pancreatic cancer Neg Hx    Stomach cancer Neg Hx     Past Surgical History:  Procedure Laterality Date   FRACTURE SURGERY     MANDIBLE FRACTURE SURGERY      ROS: Review of Systems Negative except as stated above  PHYSICAL EXAM: There were no vitals taken for this visit.  Physical Exam  {male adult master:310786} {male adult master:310785}     Latest Ref Rng & Units 01/08/2022   12:00 AM 12/14/2021   11:12 AM 04/02/2020    5:14 PM  CMP  Glucose 70 - 99 mg/dL  105  120   BUN 6 - 24 mg/dL  11  9   Creatinine 0.76 - 1.27 mg/dL  0.70  0.78   Sodium 134 - 144 mmol/L  145  138   Potassium 3.5 - 5.2 mmol/L  3.9  2.9   Chloride 96 - 106 mmol/L  106  102   CO2 20 - 29 mmol/L  24  25   Calcium 8.7 - 10.2 mg/dL  9.3  8.5   Total Protein 6.0 - 8.5 g/dL 7.1     Total Bilirubin 0.0 - 1.2 mg/dL <0.2     Alkaline Phos 44 - 121 IU/L 121     AST 0 - 40 IU/L 35     ALT 0 - 44 IU/L 27      Lipid Panel     Component Value Date/Time   CHOL 182 01/08/2022 0000   TRIG 63 01/08/2022 0000   HDL 56 01/08/2022 0000   CHOLHDL 3.3 01/08/2022 0000   CHOLHDL 5 04/24/2013 1130   VLDL 12.2 04/24/2013 1130   LDLCALC 114 (H) 01/08/2022 0000    CBC    Component Value Date/Time   WBC 5.4 01/08/2022 0000   WBC 6.7 04/02/2020 1714   RBC 5.00 01/08/2022 0000   RBC 4.66 04/02/2020 1714   HGB 13.6 01/08/2022 0000   HCT 41.0 01/08/2022 0000   PLT 272 01/08/2022 0000   MCV 82 01/08/2022 0000   MCH 27.2 01/08/2022 0000   MCH 27.5 04/02/2020 1714   MCHC 33.2 01/08/2022 0000   MCHC 33.5 04/02/2020 1714   RDW 14.7 01/08/2022 0000   LYMPHSABS 2.0 06/23/2013 1344   MONOABS 0.4 06/23/2013 1344   EOSABS 0.1 06/23/2013 1344   BASOSABS 0.1 06/23/2013 1344    ASSESSMENT AND PLAN:  There are no diagnoses linked to this encounter.   Patient was given the opportunity to ask questions.  Patient verbalized understanding of the plan and was able to repeat key elements of the plan.  Patient was given clear instructions to go to Emergency Department or return to medical center if symptoms don't improve, worsen, or new problems develop.The patient verbalized understanding.  No orders of the defined types were placed in this encounter.    Requested Prescriptions    No prescriptions requested or ordered in this encounter    No follow-ups on file.  Camillia Herter, NP

## 2022-09-28 ENCOUNTER — Inpatient Hospital Stay: Payer: Managed Care, Other (non HMO) | Admitting: Family

## 2022-09-28 DIAGNOSIS — G8929 Other chronic pain: Secondary | ICD-10-CM

## 2022-10-02 NOTE — Progress Notes (Signed)
Patient ID: Carl Greene, male    DOB: 06-27-1976  MRN: [id number]  CC: Emergency Department Follow-Up  Subjective: Carl Greene is a 46 y.o. male who presents for emergency department follow-up.   His concerns today include:  08/19/2022 - 08/20/2022 [facility] Emergency Department per MD note: Medical Decision Making Patient presents today with intermittent back pain x1 year.  He is afebrile, nontoxic-appearing, and in no acute distress with reassuring vital signs.  Physical exam reveals no neurological deficits and normal neuro exam.  Patient can walk with steady gait with some pain.  No loss of bowel or bladder control or saddle paresthesias.  No concern for cauda equina.  No fever, night sweats, weight loss, h/o cancer, IVDU.  X-ray imaging unremarkable for acute findings.  I have personally reviewed and interpreted this imaging and agree with radiology interpretation.  No dysuria or hematuria. We will give a Medrol Dosepak and muscle relaxer as patient states that this did give him relief previously.  Also given lidocaine patch.  Will give neurosurgery referral for continued evaluation and management per the patient's request.  Patient is stable for discharge, educated on red flag symptoms of prompt immediate reurn.  Patient is understanding and amenable with plan, discharged in stable condition.t   Follow-Ups: Call Judith Part, MD (Neurosurgery)  Today's visit 09/28/2022: Request refills on blood pressure and acid reflux medications. Has not taken in a while due to needing refills. Denies red flag symptoms. Established with [facility] for management of back pain. No issues/concerns for today.   Patient Active Problem List   Diagnosis Date Noted   Myofascial pain syndrome 09/03/2022   Myofascial pain 09/03/2022   Low back strain 09/03/2022   Low back pain 08/22/2022   Soft corn 07/27/2022   Ulcer of toe ([place]) 07/27/2022   Ceruminosis, bilateral 12/15/2021    Goiter 04/23/2011   Tobacco use disorder 04/23/2011   HYPERTENSION, BENIGN ESSENTIAL 03/07/2010   DYSPNEA 03/07/2010   CHEST PAIN-UNSPECIFIED 03/07/2010     Current Outpatient Medications on File Prior to Visit  Medication Sig Dispense Refill   cyclobenzaprine (FLEXERIL) 5 MG tablet Take 1 tablet (5 mg total) by mouth 2 (two) times daily as needed for muscle spasms. 20 tablet 0   lidocaine (LIDODERM) 5 % Place 1 patch onto the skin daily. Remove & Discard patch within 12 hours or as directed by MD 30 patch 0   methocarbamol (ROBAXIN) 500 MG tablet Take 1 tablet (500 mg total) by mouth 2 (two) times daily. 20 tablet 0   No current facility-administered medications on file prior to visit.    No Known Allergies  Social History   Socioeconomic History   Marital status: Single    Spouse name: Not on file   Number of children: Not on file   Years of education: Not on file   Highest education level: Not on file  Occupational History   Not on file  Tobacco Use   Smoking status: Former    Packs/day: 0.50    Types: Cigarettes    Quit date: 03/12/2013    Years since quitting: 9.5    Passive exposure: Past   Smokeless tobacco: Never  Vaping Use   Vaping Use: Never used  Substance and Sexual Activity   Alcohol use: No   Drug use: Yes    Types: Marijuana   Sexual activity: Not on file  Other Topics Concern   Not on file  Social History Narrative  Not on file   Social Determinants of Health   Financial Resource Strain: Not on file  Food Insecurity: Not on file  Transportation Needs: Not on file  Physical Activity: Not on file  Stress: Not on file  Social Connections: Not on file  Intimate Partner Violence: Not on file    Family History  Problem Relation Age of Onset   Hypertension Mother    Colon polyps Maternal Uncle    Coronary artery disease Maternal Grandfather    Diabetes Mellitus I Other    Colon cancer Neg Hx    Esophageal cancer Neg Hx    Pancreatic cancer  Neg Hx    Stomach cancer Neg Hx     Past Surgical History:  Procedure Laterality Date   FRACTURE SURGERY     MANDIBLE FRACTURE SURGERY      ROS: Review of Systems Negative except as stated above  PHYSICAL EXAM: BP (!) 144/92 (BP Location: Left Arm, Patient Position: Sitting, Cuff Size: Large)   Pulse 90   Temp 98.3 F (36.8 C)   Resp 16   Ht 5' 7.72" (1.72 m)   Wt 165 lb (74.8 kg)   SpO2 98%   BMI 25.30 kg/m   Physical Exam HENT:     Head: Normocephalic and atraumatic.  Eyes:     Extraocular Movements: Extraocular movements intact.     Conjunctiva/sclera: Conjunctivae normal.     Pupils: Pupils are equal, round, and reactive to light.  Cardiovascular:     Rate and Rhythm: Normal rate and regular rhythm.     Pulses: Normal pulses.     Heart sounds: Normal heart sounds.  Pulmonary:     Effort: Pulmonary effort is normal.     Breath sounds: Normal breath sounds.  Musculoskeletal:     Cervical back: Normal range of motion and neck supple.  Neurological:     General: No focal deficit present.     Mental Status: He is alert and oriented to person, place, and time.  Psychiatric:        Mood and Affect: Mood normal.        Behavior: Behavior normal.      ASSESSMENT AND PLAN: 1. Primary hypertension - Blood pressure not at goal during today's visit. Patient asymptomatic without chest pressure, chest pain, palpitations, shortness of breath, worst headache of life, and any additional red flag symptoms. - Resume Valsartan and Amlodipine as prescribed.  - Counseled on blood pressure goal of less than 130/80, low-sodium, DASH diet, medication compliance, and 150 minutes of moderate intensity exercise per week as tolerated. Counseled on medication adherence and adverse effects. - Update BMP.  - Follow-up with primary provider in 2 weeks or sooner if needed for blood pressure check.  - Basic Metabolic Panel - valsartan (DIOVAN) 40 MG tablet; Take 1 tablet (40 mg total) by  mouth daily.  Dispense: 30 tablet; Refill: 2 - amLODipine (NORVASC) 10 MG tablet; Take 1 tablet (10 mg total) by mouth daily.  Dispense: 30 tablet; Refill: 2  2. Prediabetes - Update hemoglobin A1c. - Hemoglobin A1c  3. Gastroesophageal reflux disease, unspecified whether esophagitis present - Continue Omeprazole as prescribed.  - Follow-up with primary provider as scheduled.  - omeprazole (PRILOSEC) 20 MG capsule; Take 1 capsule (20 mg total) by mouth daily.  Dispense: 30 capsule; Refill: 2    Patient was given the opportunity to ask questions.  Patient verbalized understanding of the plan and was able to repeat key elements of the  plan. Patient was given clear instructions to go to Emergency Department or return to medical center if symptoms don't improve, worsen, or new problems develop.The patient verbalized understanding.   Orders Placed This Encounter  Procedures   Basic Metabolic Panel   Hemoglobin A1c     Requested Prescriptions   Signed Prescriptions Disp Refills   valsartan (DIOVAN) 40 MG tablet 30 tablet 2    Sig: Take 1 tablet (40 mg total) by mouth daily.   omeprazole (PRILOSEC) 20 MG capsule 30 capsule 2    Sig: Take 1 capsule (20 mg total) by mouth daily.   amLODipine (NORVASC) 10 MG tablet 30 tablet 2    Sig: Take 1 tablet (10 mg total) by mouth daily.    Return in about 2 weeks (around 10/23/2022) for Follow-Up or next available bp check .  Camillia Herter, NP

## 2022-10-09 ENCOUNTER — Encounter: Payer: Self-pay | Admitting: Family

## 2022-10-09 ENCOUNTER — Ambulatory Visit (INDEPENDENT_AMBULATORY_CARE_PROVIDER_SITE_OTHER): Payer: Managed Care, Other (non HMO) | Admitting: Family

## 2022-10-09 VITALS — BP 144/92 | HR 90 | Temp 98.3°F | Resp 16 | Ht 67.72 in | Wt 165.0 lb

## 2022-10-09 DIAGNOSIS — I10 Essential (primary) hypertension: Secondary | ICD-10-CM | POA: Diagnosis not present

## 2022-10-09 DIAGNOSIS — K219 Gastro-esophageal reflux disease without esophagitis: Secondary | ICD-10-CM | POA: Diagnosis not present

## 2022-10-09 DIAGNOSIS — R7303 Prediabetes: Secondary | ICD-10-CM | POA: Diagnosis not present

## 2022-10-09 MED ORDER — OMEPRAZOLE 20 MG PO CPDR: 20.0000 mg | DELAYED_RELEASE_CAPSULE | Freq: Every day | ORAL | 2 refills | Status: DC

## 2022-10-09 MED ORDER — AMLODIPINE BESYLATE 10 MG PO TABS: 10.0000 mg | ORAL_TABLET | Freq: Every day | ORAL | 2 refills | Status: DC

## 2022-10-09 MED ORDER — VALSARTAN 40 MG PO TABS: 40.0000 mg | ORAL_TABLET | Freq: Every day | ORAL | 2 refills | Status: DC

## 2022-10-09 NOTE — Progress Notes (Signed)
.  Pt presents for hypertension f/u   -needs refills on Valsartan, Omeprazole, and Amlodipine -has been out of medication for while

## 2022-10-10 ENCOUNTER — Encounter: Payer: Self-pay | Admitting: Family

## 2022-10-10 DIAGNOSIS — R7303 Prediabetes: Secondary | ICD-10-CM | POA: Insufficient documentation

## 2022-10-10 LAB — BASIC METABOLIC PANEL
BUN/Creatinine Ratio: 7 — ABNORMAL LOW (ref 9–20)
BUN: 6 mg/dL (ref 6–24)
CO2: 27 mmol/L (ref 20–29)
Calcium: 8.9 mg/dL (ref 8.7–10.2)
Chloride: 103 mmol/L (ref 96–106)
Creatinine, Ser: 0.83 mg/dL (ref 0.76–1.27)
Glucose: 112 mg/dL — ABNORMAL HIGH (ref 70–99)
Potassium: 3.6 mmol/L (ref 3.5–5.2)
Sodium: 142 mmol/L (ref 134–144)
eGFR: 109 mL/min/{1.73_m2} (ref >59)

## 2022-10-10 LAB — HEMOGLOBIN A1C
Est. average glucose Bld gHb Est-mCnc: 126 mg/dL
Hgb A1c MFr Bld: 6 % — ABNORMAL HIGH (ref 4.8–5.6)

## 2022-10-16 NOTE — Progress Notes (Signed)
Patient ID: Carl Greene, male    DOB: 1976/08/11  MRN: [id number]  CC: Blood Pressure Check   Subjective: Carl Greene is a 46 y.o. male who presents for blood pressure check.   His concerns today include:  Doing well on blood pressure medications, no issues/concerns. Denies red flag symptoms.   Patient Active Problem List   Diagnosis Date Noted   Prediabetes 10/10/2022   Myofascial pain syndrome 09/03/2022   Myofascial pain 09/03/2022   Low back strain 09/03/2022   Low back pain 08/22/2022   Soft corn 07/27/2022   Ulcer of toe ([place]) 07/27/2022   Ceruminosis, bilateral 12/15/2021   Goiter 04/23/2011   Tobacco use disorder 04/23/2011   HYPERTENSION, BENIGN ESSENTIAL 03/07/2010   DYSPNEA 03/07/2010   CHEST PAIN-UNSPECIFIED 03/07/2010     Current Outpatient Medications on File Prior to Visit  Medication Sig Dispense Refill   amLODipine (NORVASC) 10 MG tablet Take 1 tablet (10 mg total) by mouth daily. 30 tablet 2   cyclobenzaprine (FLEXERIL) 5 MG tablet Take 1 tablet (5 mg total) by mouth 2 (two) times daily as needed for muscle spasms. 20 tablet 0   lidocaine (LIDODERM) 5 % Place 1 patch onto the skin daily. Remove & Discard patch within 12 hours or as directed by MD 30 patch 0   methocarbamol (ROBAXIN) 500 MG tablet Take 1 tablet (500 mg total) by mouth 2 (two) times daily. 20 tablet 0   omeprazole (PRILOSEC) 20 MG capsule Take 1 capsule (20 mg total) by mouth daily. 30 capsule 2   valsartan (DIOVAN) 40 MG tablet Take 1 tablet (40 mg total) by mouth daily. 30 tablet 2   No current facility-administered medications on file prior to visit.    No Known Allergies  Social History   Socioeconomic History   Marital status: Single    Spouse name: Not on file   Number of children: Not on file   Years of education: Not on file   Highest education level: Not on file  Occupational History   Not on file  Tobacco Use   Smoking status: Former    Packs/day: 0.50     Types: Cigarettes    Quit date: 03/12/2013    Years since quitting: 9.6    Passive exposure: Past   Smokeless tobacco: Never  Vaping Use   Vaping Use: Never used  Substance and Sexual Activity   Alcohol use: No   Drug use: Yes    Types: Marijuana   Sexual activity: Not on file  Other Topics Concern   Not on file  Social History Narrative   Not on file   Social Determinants of Health   Financial Resource Strain: Not on file  Food Insecurity: Not on file  Transportation Needs: Not on file  Physical Activity: Not on file  Stress: Not on file  Social Connections: Not on file  Intimate Partner Violence: Not on file    Family History  Problem Relation Age of Onset   Hypertension Mother    Colon polyps Maternal Uncle    Coronary artery disease Maternal Grandfather    Diabetes Mellitus I Other    Colon cancer Neg Hx    Esophageal cancer Neg Hx    Pancreatic cancer Neg Hx    Stomach cancer Neg Hx     Past Surgical History:  Procedure Laterality Date   FRACTURE SURGERY     MANDIBLE FRACTURE SURGERY      ROS: Review of Systems Negative  except as stated above  PHYSICAL EXAM: BP 136/89 (BP Location: Left Arm, Patient Position: Sitting, Cuff Size: Large)   Pulse 86   Temp 98.3 F (36.8 C)   Resp 16   Ht 5' 7.72" (1.72 m)   Wt 165 lb (74.8 kg)   SpO2 97%   BMI 25.30 kg/m   Physical Exam HENT:     Head: Normocephalic and atraumatic.  Eyes:     Extraocular Movements: Extraocular movements intact.     Conjunctiva/sclera: Conjunctivae normal.     Pupils: Pupils are equal, round, and reactive to light.  Cardiovascular:     Rate and Rhythm: Normal rate and regular rhythm.     Pulses: Normal pulses.     Heart sounds: Normal heart sounds.  Pulmonary:     Effort: Pulmonary effort is normal.     Breath sounds: Normal breath sounds.  Musculoskeletal:     Cervical back: Normal range of motion and neck supple.  Neurological:     General: No focal deficit present.      Mental Status: He is alert and oriented to person, place, and time.  Psychiatric:        Mood and Affect: Mood normal.        Behavior: Behavior normal.    ASSESSMENT AND PLAN: 1. Primary hypertension - Blood pressure improved compared to last visit.  - Continue Valsartan and Amlodipine as prescribed. No refills needed as of present.  - Counseled on blood pressure goal of less than 130/80, low-sodium, DASH diet, medication compliance, and 150 minutes of moderate intensity exercise per week as tolerated. Counseled on medication adherence and adverse effects. - Follow-up with primary provider in 3 months or sooner if needed.    Patient was given the opportunity to ask questions.  Patient verbalized understanding of the plan and was able to repeat key elements of the plan. Patient was given clear instructions to go to Emergency Department or return to medical center if symptoms don't improve, worsen, or new problems develop.The patient verbalized understanding.   Return in about 3 months (around 01/22/2023) for Follow-Up or next available chronic care mgmt .  Camillia Herter, NP

## 2022-10-23 ENCOUNTER — Ambulatory Visit (INDEPENDENT_AMBULATORY_CARE_PROVIDER_SITE_OTHER): Payer: Managed Care, Other (non HMO) | Admitting: Family

## 2022-10-23 VITALS — BP 136/89 | HR 86 | Temp 98.3°F | Resp 16 | Ht 67.72 in | Wt 165.0 lb

## 2022-10-23 DIAGNOSIS — I10 Essential (primary) hypertension: Secondary | ICD-10-CM

## 2022-10-23 NOTE — Progress Notes (Signed)
.  Pt presents for blood pressure check

## 2022-10-29 ENCOUNTER — Ambulatory Visit
Admission: EM | Admit: 2022-10-29 | Discharge: 2022-10-29 | Disposition: A | Discharge status: Home / Self Care | Payer: Managed Care, Other (non HMO) | Attending: Physician Assistant | Admitting: Physician Assistant

## 2022-10-29 DIAGNOSIS — Z1152 Encounter for screening for COVID-19: Secondary | ICD-10-CM | POA: Diagnosis present

## 2022-10-29 DIAGNOSIS — J069 Acute upper respiratory infection, unspecified: Secondary | ICD-10-CM | POA: Insufficient documentation

## 2022-10-29 DIAGNOSIS — R051 Acute cough: Secondary | ICD-10-CM | POA: Insufficient documentation

## 2022-10-29 LAB — RESP PANEL BY RT-PCR (RSV, FLU A&B, COVID)  RVPGX2
Influenza A by PCR: NEGATIVE
Influenza B by PCR: NEGATIVE
Resp Syncytial Virus by PCR: NEGATIVE
SARS Coronavirus 2 by RT PCR: NEGATIVE

## 2022-10-29 NOTE — Discharge Instructions (Signed)
Testing for flu/RSV/COVID to be completed in 48 hours.  If you do not get a call from this office that indicates the test are negative.  Log onto MyChart to view the test results when they post in 48 hours.  Advised to continue taking Alka-Seltzer cold and flu to help relieve symptoms. Advised to follow-up PCP or return to urgent care if symptoms fail to improve.

## 2022-10-29 NOTE — ED Provider Notes (Signed)
[name]    CSN: [id number] Arrival date & time: 10/29/22  1348      History   Chief Complaint Chief Complaint  Patient presents with   Cough    HPI Carl Greene is a 46 y.o. male.   -year-old male presents with congestion and cough.  Patient indicates for the past 2 days he has been having progressive cough, chest congestion with mainly clear production.  He also indicates he is having some upper respiratory congestion with rhinitis and postnasal drip clear production.  Intermittent headache with the congestion.  He also indicates that he has had mild fatigue, body aches and pains, chills.  Patient indicates he was around his bad respiratory symptoms shortly afterwards he started becoming sick.  Patient is using Alka-Seltzer cold and flu which is helping to relieve his symptoms.  Patient request a flu/COVID/RSV test.   Cough Associated symptoms: rhinorrhea     Past Medical History:  Diagnosis Date   Chest pain    Echo EF 50-55%. [facility] study 4/11: walked 9:15 no ecg changes. EF 42%?  inferoir attenuation no scar.    Dyspnea    GERD (gastroesophageal reflux disease)    HTN (hypertension)    Hx of gastric ulcer    Lightheadedness    with CP    Patient Active Problem List   Diagnosis Date Noted   Prediabetes 10/10/2022   Myofascial pain syndrome 09/03/2022   Myofascial pain 09/03/2022   Low back strain 09/03/2022   Low back pain 08/22/2022   Soft corn 07/27/2022   Ulcer of toe ([place]) 07/27/2022   Ceruminosis, bilateral 12/15/2021   Goiter 04/23/2011   Tobacco use disorder 04/23/2011   HYPERTENSION, BENIGN ESSENTIAL 03/07/2010   DYSPNEA 03/07/2010   CHEST PAIN-UNSPECIFIED 03/07/2010    Past Surgical History:  Procedure Laterality Date   FRACTURE SURGERY     MANDIBLE FRACTURE SURGERY         Home Medications    Prior to Admission medications   Medication Sig Start Date End Date Taking? Authorizing Provider  amLODipine (NORVASC) 10 MG  tablet Take 1 tablet (10 mg total) by mouth daily. 10/09/22 01/07/23  Camillia Herter, NP  cyclobenzaprine (FLEXERIL) 5 MG tablet Take 1 tablet (5 mg total) by mouth 2 (two) times daily as needed for muscle spasms. 03/07/22   Teodora Medici, FNP  lidocaine (LIDODERM) 5 % Place 1 patch onto the skin daily. Remove & Discard patch within 12 hours or as directed by MD 08/20/22   Smoot, Leary Roca, PA-C  methocarbamol (ROBAXIN) 500 MG tablet Take 1 tablet (500 mg total) by mouth 2 (two) times daily. 08/20/22   Smoot, Leary Roca, PA-C  omeprazole (PRILOSEC) 20 MG capsule Take 1 capsule (20 mg total) by mouth daily. 10/09/22 01/07/23  Camillia Herter, NP  valsartan (DIOVAN) 40 MG tablet Take 1 tablet (40 mg total) by mouth daily. 10/09/22 01/07/23  Camillia Herter, NP    Family History Family History  Problem Relation Age of Onset   Hypertension Mother    Colon polyps Maternal Uncle    Coronary artery disease Maternal Grandfather    Diabetes Mellitus I Other    Colon cancer Neg Hx    Esophageal cancer Neg Hx    Pancreatic cancer Neg Hx    Stomach cancer Neg Hx     Social History Social History   Tobacco Use   Smoking status: Former    Packs/day: 0.50    Types:  Cigarettes    Quit date: 03/12/2013    Years since quitting: 9.6    Passive exposure: Past   Smokeless tobacco: Never  Vaping Use   Vaping Use: Never used  Substance Use Topics   Alcohol use: No   Drug use: Yes    Types: Marijuana     Allergies   Patient has no known allergies.   Review of Systems Review of Systems  HENT:  Positive for postnasal drip, rhinorrhea and sinus pressure.   Respiratory:  Positive for cough.      Physical Exam Triage Vital Signs ED Triage Vitals [10/29/22 1417]  Enc Vitals Group     BP (!) 168/110     Pulse Rate 99     Resp 16     Temp 98.3 F (36.8 C)     Temp Source Oral     SpO2 98 %     Weight      Height      Head Circumference      Peak Flow      Pain Score 6     Pain Loc      Pain  Edu?      Excl. in [place]?    No data found.  Updated Vital Signs BP (!) 168/110 (BP Location: Left Arm)   Pulse 99   Temp 98.3 F (36.8 C) (Oral)   Resp 16   SpO2 98%   Visual Acuity Right Eye Distance:   Left Eye Distance:   Bilateral Distance:    Right Eye Near:   Left Eye Near:    Bilateral Near:     Physical Exam Constitutional:      Appearance: Normal appearance.  HENT:     Right Ear: Tympanic membrane and ear canal normal.     Left Ear: Tympanic membrane and ear canal normal.     Mouth/Throat:     Mouth: Mucous membranes are moist.     Pharynx: Oropharynx is clear.  Cardiovascular:     Rate and Rhythm: Normal rate and regular rhythm.     Heart sounds: Normal heart sounds.  Pulmonary:     Effort: Pulmonary effort is normal.     Breath sounds: Normal breath sounds and air entry. No wheezing, rhonchi or rales.  Lymphadenopathy:     Cervical: No cervical adenopathy.  Neurological:     Mental Status: He is alert.      UC Treatments / Results  Labs (all labs ordered are listed, but only abnormal results are displayed) Labs Reviewed  RESP PANEL BY RT-PCR (RSV, FLU A&B, COVID)  [id number]    EKG   Radiology No results found.  Procedures Procedures (including critical care time)  Medications Ordered in UC Medications - No data to display  Initial Impression / Assessment and Plan / UC Course  I have reviewed the triage vital signs and the nursing notes.  Pertinent labs & imaging results that were available during my care of the patient were reviewed by me and considered in my medical decision making (see chart for details).    Plan: 1.  The acute cough will be treated with the following: A.  Advised patient to continue taking Alka-Seltzer cold and flu to treat the symptoms. 2.  The acute upper respiratory infection be treated with the following: A.  Advised patient to continue taking Alka-Seltzer cold and flu to help treat the symptoms. 3.  Screening  for COVID-19 will be treated with the following: A.  Treatment  will be initiated depending on the test results of the COVID/flu/RSV test. 4.  Advised follow-up PCP or return to urgent care if symptoms fail to improve. Final Clinical Impressions(s) / UC Diagnoses   Final diagnoses:  Acute cough  Acute upper respiratory infection  Encounter for screening for COVID-19     Discharge Instructions      Testing for flu/RSV/COVID to be completed in 48 hours.  If you do not get a call from this office that indicates the test are negative.  Log onto MyChart to view the test results when they post in 48 hours.  Advised to continue taking Alka-Seltzer cold and flu to help relieve symptoms. Advised to follow-up PCP or return to urgent care if symptoms fail to improve.    ED Prescriptions   None    PDMP not reviewed this encounter.   Nyoka Lint, PA-C 10/29/22 1441

## 2022-10-29 NOTE — ED Triage Notes (Signed)
Pt c/o cough, body chills, lack of appetite   Denies sore throat  Onset ~ last night   Requesting covid rsv test.   States did not take htn meds this morning.

## 2022-11-09 DIAGNOSIS — M5416 Radiculopathy, lumbar region: Secondary | ICD-10-CM | POA: Insufficient documentation

## 2022-11-10 DIAGNOSIS — S39012A Strain of muscle, fascia and tendon of lower back, initial encounter: Secondary | ICD-10-CM | POA: Insufficient documentation

## 2022-11-24 ENCOUNTER — Ambulatory Visit (HOSPITAL_COMMUNITY)
Admission: EM | Admit: 2022-11-24 | Discharge: 2022-11-24 | Disposition: A | Discharge status: Home / Self Care | Payer: Managed Care, Other (non HMO)

## 2022-11-25 ENCOUNTER — Ambulatory Visit
Admission: EM | Admit: 2022-11-25 | Discharge: 2022-11-25 | Disposition: A | Discharge status: Home / Self Care | Payer: Managed Care, Other (non HMO) | Attending: Internal Medicine | Admitting: Internal Medicine

## 2022-11-25 DIAGNOSIS — M549 Dorsalgia, unspecified: Secondary | ICD-10-CM | POA: Diagnosis not present

## 2022-11-25 DIAGNOSIS — M542 Cervicalgia: Secondary | ICD-10-CM

## 2022-11-25 MED ORDER — METHOCARBAMOL 500 MG PO TABS: 500.0000 mg | ORAL_TABLET | Freq: Two times a day (BID) | ORAL | 0 refills | Status: AC | PRN
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
## 2023-01-11 ENCOUNTER — Telehealth: Payer: Self-pay | Admitting: Family

## 2023-01-11 NOTE — Telephone Encounter (Signed)
Medication Refill - Medication: valsartan (DIOVAN) 40 MG tablet [id number]  ENDED   amLODipine (NORVASC) 10 MG tablet [id number]  ENDED   Pt has 2 pills left  Has the patient contacted their pharmacy? No. (Agent: If no, request that the patient contact the pharmacy for the refill. If patient does not wish to contact the pharmacy document the reason why and proceed with request.) (Agent: If yes, when and what did the pharmacy advise?)  Preferred Pharmacy (with phone number or street name):   [facility] ([address] [place] [facility] [place] [id number] [place] [place] [address] Phone: [phone]  Fax: [phone]   Has the patint been seen for an appointment in the last year OR does the patient have an upcoming appointment? Yes.  01/22/23  Agent: Please be advised that RX refills may take up to 3 business days. We ask that you follow-up with your pharmacy.

## 2023-01-14 ENCOUNTER — Other Ambulatory Visit: Payer: Self-pay

## 2023-01-14 DIAGNOSIS — K219 Gastro-esophageal reflux disease without esophagitis: Secondary | ICD-10-CM

## 2023-01-14 DIAGNOSIS — I10 Essential (primary) hypertension: Secondary | ICD-10-CM

## 2023-01-14 MED ORDER — AMLODIPINE BESYLATE 10 MG PO TABS: 10.0000 mg | ORAL_TABLET | Freq: Every day | ORAL | 2 refills | Status: DC

## 2023-01-14 MED ORDER — OMEPRAZOLE 20 MG PO CPDR: 20.0000 mg | DELAYED_RELEASE_CAPSULE | Freq: Every day | ORAL | 2 refills | Status: AC

## 2023-01-14 MED ORDER — VALSARTAN 40 MG PO TABS: 40.0000 mg | ORAL_TABLET | Freq: Every day | ORAL | 2 refills | Status: DC

## 2023-01-14 NOTE — Telephone Encounter (Signed)
Request completed. Please keep scheduled appt for 01/22/23 w/Amy Minette Brine, Fnp

## 2023-01-21 NOTE — Progress Notes (Deleted)
Patient ID: Carl Greene, male    DOB: 26-Dec-1975  MRN: [id number]  CC: Chronic Care Management   Subjective: Carl Greene is a 48 y.o. male who presents for chronic care management.   His concerns today include:  HTN - Valsartan, Amlodipine  Patient Active Problem List   Diagnosis Date Noted   Prediabetes 10/10/2022   Myofascial pain syndrome 09/03/2022   Myofascial pain 09/03/2022   Low back strain 09/03/2022   Low back pain 08/22/2022   Soft corn 07/27/2022   Ulcer of toe ([place]) 07/27/2022   Ceruminosis, bilateral 12/15/2021   Goiter 04/23/2011   Tobacco use disorder 04/23/2011   HYPERTENSION, BENIGN ESSENTIAL 03/07/2010   DYSPNEA 03/07/2010   CHEST PAIN-UNSPECIFIED 03/07/2010     Current Outpatient Medications on File Prior to Visit  Medication Sig Dispense Refill   amLODipine (NORVASC) 10 MG tablet Take 1 tablet (10 mg total) by mouth daily. 30 tablet 2   lidocaine (LIDODERM) 5 % Place 1 patch onto the skin daily. Remove & Discard patch within 12 hours or as directed by MD 30 patch 0   methocarbamol (ROBAXIN) 500 MG tablet Take 1 tablet (500 mg total) by mouth 2 (two) times daily as needed for muscle spasms. 20 tablet 0   omeprazole (PRILOSEC) 20 MG capsule Take 1 capsule (20 mg total) by mouth daily. 30 capsule 2   valsartan (DIOVAN) 40 MG tablet Take 1 tablet (40 mg total) by mouth daily. 30 tablet 2   No current facility-administered medications on file prior to visit.    No Known Allergies  Social History   Socioeconomic History   Marital status: Single    Spouse name: Not on file   Number of children: Not on file   Years of education: Not on file   Highest education level: Not on file  Occupational History   Not on file  Tobacco Use   Smoking status: Former    Packs/day: 0.50    Types: Cigarettes    Quit date: 03/12/2013    Years since quitting: 9.8    Passive exposure: Past   Smokeless tobacco: Never  Vaping Use   Vaping Use: Never used   Substance and Sexual Activity   Alcohol use: No   Drug use: Yes    Types: Marijuana   Sexual activity: Not on file  Other Topics Concern   Not on file  Social History Narrative   Not on file   Social Determinants of Health   Financial Resource Strain: Not on file  Food Insecurity: Not on file  Transportation Needs: Not on file  Physical Activity: Not on file  Stress: Not on file  Social Connections: Not on file  Intimate Partner Violence: Not on file    Family History  Problem Relation Age of Onset   Hypertension Mother    Colon polyps Maternal Uncle    Coronary artery disease Maternal Grandfather    Diabetes Mellitus I Other    Colon cancer Neg Hx    Esophageal cancer Neg Hx    Pancreatic cancer Neg Hx    Stomach cancer Neg Hx     Past Surgical History:  Procedure Laterality Date   FRACTURE SURGERY     MANDIBLE FRACTURE SURGERY      ROS: Review of Systems Negative except as stated above  PHYSICAL EXAM: There were no vitals taken for this visit.  Physical Exam  {male adult master:[id number]} {male adult master:[id number]}     Latest Ref  Rng & Units 10/09/2022    4:11 PM 01/08/2022   12:00 AM 12/14/2021   11:12 AM  CMP  Glucose 70 - 99 mg/dL 112   105   BUN 6 - 24 mg/dL 6   11   Creatinine 0.76 - 1.27 mg/dL 0.83   0.70   Sodium 134 - 144 mmol/L 142   145   Potassium 3.5 - 5.2 mmol/L 3.6   3.9   Chloride 96 - 106 mmol/L 103   106   CO2 20 - 29 mmol/L 27   24   Calcium 8.7 - 10.2 mg/dL 8.9   9.3   Total Protein 6.0 - 8.5 g/dL  7.1    Total Bilirubin 0.0 - 1.2 mg/dL  <0.2    Alkaline Phos 44 - 121 IU/L  121    AST 0 - 40 IU/L  35    ALT 0 - 44 IU/L  27     Lipid Panel     Component Value Date/Time   CHOL 182 01/08/2022 0000   TRIG 63 01/08/2022 0000   HDL 56 01/08/2022 0000   CHOLHDL 3.3 01/08/2022 0000   CHOLHDL 5 04/24/2013 1130   VLDL 12.2 04/24/2013 1130   LDLCALC 114 (H) 01/08/2022 0000    CBC    Component Value Date/Time   WBC 5.4  01/08/2022 0000   WBC 6.7 04/02/2020 1714   RBC 5.00 01/08/2022 0000   RBC 4.66 04/02/2020 1714   HGB 13.6 01/08/2022 0000   HCT 41.0 01/08/2022 0000   PLT 272 01/08/2022 0000   MCV 82 01/08/2022 0000   MCH 27.2 01/08/2022 0000   MCH 27.5 04/02/2020 1714   MCHC 33.2 01/08/2022 0000   MCHC 33.5 04/02/2020 1714   RDW 14.7 01/08/2022 0000   LYMPHSABS 2.0 06/23/2013 1344   MONOABS 0.4 06/23/2013 1344   EOSABS 0.1 06/23/2013 1344   BASOSABS 0.1 06/23/2013 1344    ASSESSMENT AND PLAN:  There are no diagnoses linked to this encounter.   Patient was given the opportunity to ask questions.  Patient verbalized understanding of the plan and was able to repeat key elements of the plan. Patient was given clear instructions to go to Emergency Department or return to medical center if symptoms don't improve, worsen, or new problems develop.The patient verbalized understanding.   No orders of the defined types were placed in this encounter.    Requested Prescriptions    No prescriptions requested or ordered in this encounter    No follow-ups on file.  Camillia Herter, NP

## 2023-01-22 ENCOUNTER — Ambulatory Visit: Payer: Self-pay | Admitting: Family

## 2023-01-22 ENCOUNTER — Ambulatory Visit (INDEPENDENT_AMBULATORY_CARE_PROVIDER_SITE_OTHER): Payer: Self-pay | Admitting: Family

## 2023-01-22 VITALS — BP 126/83 | HR 76 | Temp 98.3°F | Resp 16 | Ht 67.72 in | Wt 176.0 lb

## 2023-01-22 DIAGNOSIS — I10 Essential (primary) hypertension: Secondary | ICD-10-CM

## 2023-01-22 DIAGNOSIS — Z13220 Encounter for screening for lipoid disorders: Secondary | ICD-10-CM

## 2023-01-22 DIAGNOSIS — R7303 Prediabetes: Secondary | ICD-10-CM

## 2023-01-22 NOTE — Progress Notes (Signed)
Patient ID: Carl Greene, male    DOB: 08/22/1976  MRN: [id number]  CC: Chronic Care Management   Subjective: Carl Greene is a 47 y.o. male who presents for chronic care management.   His concerns today include:  Doing well on blood pressure medications, no issues/concerns. He denies red flag symptoms such as but not limited to chest pain, shortness of breath, worst headache of life, nausea/vomiting. Checking blood pressure outside of office infrequently. No further issues/concerns for discussion on today.   Patient Active Problem List   Diagnosis Date Noted   Lumbosacral strain 11/10/2022   Lumbar radiculopathy 11/09/2022   Prediabetes 10/10/2022   Myofascial pain syndrome 09/03/2022   Myofascial pain 09/03/2022   Low back strain 09/03/2022   Low back pain 08/22/2022   Soft corn 07/27/2022   Ulcer of toe ([place]) 07/27/2022   Ceruminosis, bilateral 12/15/2021   Goiter 04/23/2011   Tobacco use disorder 04/23/2011   HYPERTENSION, BENIGN ESSENTIAL 03/07/2010   DYSPNEA 03/07/2010   CHEST PAIN-UNSPECIFIED 03/07/2010     Current Outpatient Medications on File Prior to Visit  Medication Sig Dispense Refill   amLODipine (NORVASC) 10 MG tablet Take 1 tablet (10 mg total) by mouth daily. 30 tablet 2   lidocaine (LIDODERM) 5 % Place 1 patch onto the skin daily. Remove & Discard patch within 12 hours or as directed by MD 30 patch 0   methocarbamol (ROBAXIN) 500 MG tablet Take 1 tablet (500 mg total) by mouth 2 (two) times daily as needed for muscle spasms. 20 tablet 0   omeprazole (PRILOSEC) 20 MG capsule Take 1 capsule (20 mg total) by mouth daily. 30 capsule 2   valsartan (DIOVAN) 40 MG tablet Take 1 tablet (40 mg total) by mouth daily. 30 tablet 2   No current facility-administered medications on file prior to visit.    No Known Allergies  Social History   Socioeconomic History   Marital status: Single    Spouse name: Not on file   Number of children: Not on file    Years of education: Not on file   Highest education level: Not on file  Occupational History   Not on file  Tobacco Use   Smoking status: Former    Packs/day: 0.50    Types: Cigarettes    Quit date: 03/12/2013    Years since quitting: 9.8    Passive exposure: Past   Smokeless tobacco: Never  Vaping Use   Vaping Use: Never used  Substance and Sexual Activity   Alcohol use: No   Drug use: Yes    Types: Marijuana   Sexual activity: Not on file  Other Topics Concern   Not on file  Social History Narrative   Not on file   Social Determinants of Health   Financial Resource Strain: Not on file  Food Insecurity: Not on file  Transportation Needs: Not on file  Physical Activity: Not on file  Stress: Not on file  Social Connections: Not on file  Intimate Partner Violence: Not on file    Family History  Problem Relation Age of Onset   Hypertension Mother    Colon polyps Maternal Uncle    Coronary artery disease Maternal Grandfather    Diabetes Mellitus I Other    Colon cancer Neg Hx    Esophageal cancer Neg Hx    Pancreatic cancer Neg Hx    Stomach cancer Neg Hx     Past Surgical History:  Procedure Laterality Date  FRACTURE SURGERY     MANDIBLE FRACTURE SURGERY      ROS: Review of Systems Negative except as stated above  PHYSICAL EXAM: BP 126/83 (BP Location: Left Arm, Patient Position: Sitting, Cuff Size: Large)   Pulse 76   Temp 98.3 F (36.8 C)   Resp 16   Ht 5' 7.72" (1.72 m)   Wt 176 lb (79.8 kg)   SpO2 97%   BMI 26.98 kg/m   Physical Exam HENT:     Head: Normocephalic and atraumatic.  Eyes:     Extraocular Movements: Extraocular movements intact.     Conjunctiva/sclera: Conjunctivae normal.     Pupils: Pupils are equal, round, and reactive to light.  Cardiovascular:     Rate and Rhythm: Normal rate and regular rhythm.     Pulses: Normal pulses.     Heart sounds: Normal heart sounds.  Pulmonary:     Effort: Pulmonary effort is normal.      Breath sounds: Normal breath sounds.  Musculoskeletal:     Cervical back: Normal range of motion and neck supple.  Neurological:     General: No focal deficit present.     Mental Status: He is alert and oriented to person, place, and time.  Psychiatric:        Mood and Affect: Mood normal.        Behavior: Behavior normal.       ASSESSMENT AND PLAN: 1. Primary hypertension - Continue Valsartan and Amlodipine as prescribed. No refills needed as of present.  - Counseled on blood pressure goal of less than 130/80, low-sodium, DASH diet, medication compliance, and 150 minutes of moderate intensity exercise per week as tolerated. Counseled on medication adherence and adverse effects. - Follow-up with primary provider in 6 months or sooner if needed.   2. Screening cholesterol level - Routine screening.  - Lipid panel  3. Prediabetes - Routine screening.  - Hemoglobin A1c   Patient was given the opportunity to ask questions.  Patient verbalized understanding of the plan and was able to repeat key elements of the plan. Patient was given clear instructions to go to Emergency Department or return to medical center if symptoms don't improve, worsen, or new problems develop.The patient verbalized understanding.   Orders Placed This Encounter  Procedures   Lipid panel   Hemoglobin A1c    Return in about 6 months (around 07/25/2023) for Follow-Up or next available chronic care mgmt .  Camillia Herter, NP

## 2023-01-23 ENCOUNTER — Other Ambulatory Visit: Payer: Self-pay | Admitting: Family

## 2023-01-23 DIAGNOSIS — E785 Hyperlipidemia, unspecified: Secondary | ICD-10-CM | POA: Insufficient documentation

## 2023-01-23 LAB — LIPID PANEL
Chol/HDL Ratio: 4 ratio (ref 0.0–5.0)
Cholesterol, Total: 174 mg/dL (ref 100–199)
HDL: 43 mg/dL (ref >39)
LDL Chol Calc (NIH): 116 mg/dL — ABNORMAL HIGH (ref 0–99)
Triglycerides: 79 mg/dL (ref 0–149)
VLDL Cholesterol Cal: 15 mg/dL (ref 5–40)

## 2023-01-23 LAB — HEMOGLOBIN A1C
Est. average glucose Bld gHb Est-mCnc: 134 mg/dL
Hgb A1c MFr Bld: 6.3 % — ABNORMAL HIGH (ref 4.8–5.6)

## 2023-01-23 MED ORDER — ATORVASTATIN CALCIUM 20 MG PO TABS: 20.0000 mg | ORAL_TABLET | Freq: Every day | ORAL | 0 refills | Status: DC

## 2023-03-05 ENCOUNTER — Other Ambulatory Visit: Payer: Self-pay

## 2023-04-03 ENCOUNTER — Encounter: Payer: Self-pay | Admitting: Family

## 2023-04-19 ENCOUNTER — Other Ambulatory Visit: Payer: Self-pay | Admitting: Family

## 2023-04-19 DIAGNOSIS — E785 Hyperlipidemia, unspecified: Secondary | ICD-10-CM

## 2023-05-06 ENCOUNTER — Other Ambulatory Visit: Payer: Self-pay | Admitting: Family

## 2023-05-06 DIAGNOSIS — I10 Essential (primary) hypertension: Secondary | ICD-10-CM

## 2023-06-02 ENCOUNTER — Other Ambulatory Visit: Payer: Self-pay | Admitting: Family Medicine

## 2023-06-02 DIAGNOSIS — I10 Essential (primary) hypertension: Secondary | ICD-10-CM

## 2023-07-05 ENCOUNTER — Other Ambulatory Visit: Payer: Self-pay | Admitting: Family

## 2023-07-05 ENCOUNTER — Other Ambulatory Visit: Payer: Self-pay

## 2023-07-05 DIAGNOSIS — I10 Essential (primary) hypertension: Secondary | ICD-10-CM

## 2023-07-05 MED ORDER — AMLODIPINE BESYLATE 10 MG PO TABS: 10.0000 mg | ORAL_TABLET | Freq: Every day | ORAL | 0 refills | Status: AC

## 2023-07-05 MED ORDER — VALSARTAN 40 MG PO TABS: 40.0000 mg | ORAL_TABLET | Freq: Every day | ORAL | 0 refills | Status: AC

## 2023-07-26 ENCOUNTER — Ambulatory Visit: Payer: Managed Care, Other (non HMO) | Admitting: Family

## 2023-09-05 ENCOUNTER — Emergency Department (HOSPITAL_COMMUNITY): Payer: BLUE CROSS/BLUE SHIELD

## 2023-09-05 ENCOUNTER — Other Ambulatory Visit: Payer: Self-pay

## 2023-09-05 ENCOUNTER — Encounter (HOSPITAL_COMMUNITY): Payer: Self-pay

## 2023-09-05 ENCOUNTER — Emergency Department (HOSPITAL_COMMUNITY)
Admission: EM | Admit: 2023-09-05 | Discharge: 2023-09-05 | Disposition: A | Discharge status: Home / Self Care | Payer: BLUE CROSS/BLUE SHIELD | Attending: Emergency Medicine | Admitting: Emergency Medicine

## 2023-09-05 DIAGNOSIS — I10 Essential (primary) hypertension: Secondary | ICD-10-CM | POA: Insufficient documentation

## 2023-09-05 DIAGNOSIS — Z79899 Other long term (current) drug therapy: Secondary | ICD-10-CM | POA: Insufficient documentation

## 2023-09-05 DIAGNOSIS — M542 Cervicalgia: Secondary | ICD-10-CM | POA: Insufficient documentation

## 2023-09-05 DIAGNOSIS — S02832A Fracture of medial orbital wall, left side, initial encounter for closed fracture: Secondary | ICD-10-CM | POA: Insufficient documentation

## 2023-09-05 DIAGNOSIS — S0990XA Unspecified injury of head, initial encounter: Secondary | ICD-10-CM | POA: Diagnosis present

## 2023-09-05 DIAGNOSIS — T07XXXA Unspecified multiple injuries, initial encounter: Secondary | ICD-10-CM

## 2023-09-05 MED ORDER — OXYCODONE HCL 5 MG PO TABS
5.0000 mg | ORAL_TABLET | Freq: Once | ORAL | Status: AC
  Administered 2023-09-05: 5 mg via ORAL
  Filled 2023-09-05: qty 1

## 2023-09-05 MED ORDER — IBUPROFEN 800 MG PO TABS
800.0000 mg | ORAL_TABLET | Freq: Once | ORAL | Status: AC
  Administered 2023-09-05: 800 mg via ORAL
  Filled 2023-09-05: qty 1

## 2023-09-05 MED ORDER — OXYCODONE HCL 5 MG PO TABS: 5.0000 mg | ORAL_TABLET | ORAL | 0 refills | Status: AC | PRN

## 2023-09-05 MED ORDER — ACETAMINOPHEN 500 MG PO TABS
1000.0000 mg | ORAL_TABLET | Freq: Once | ORAL | Status: AC
  Administered 2023-09-05: 1000 mg via ORAL
  Filled 2023-09-05: qty 2

## 2023-09-05 NOTE — Discharge Instructions (Addendum)
Thank you for allowing [place] to be a part of your care today.  Your CT scan shows that you have a small fracture to the bone around your eye on the left side and some blood in your sinus (which is expected).  Your canine tooth on the lower left had an incidental finding that is concerning for dental disease.  Please follow up with a dentist.   DO NOT BLOW YOUR NOSE until you are cleared by ENT to do so.  Apply ice for the next 24-48 hours on your injuries. Do this 2-3 times per day for no longer than 20 minutes at a time. Always use a barrier between your skin and the ice to prevent skin injury.   Take 800 mg of ibuprofen every 6-8 hours for mild-moderate pain. If you have severe or breakthrough pain, take the oxycodone.  Do not consume alcohol, drive, or perform other tasks that require you to be completely awake while taking oxycodone as this medication can be sedating.  Schedule a follow up appointment with both ENT and the ophthalmologist.   Return to the ED if you develop sudden worsening of your symptoms or if you have new concerns.
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
## 2023-11-17 DIAGNOSIS — H6121 Impacted cerumen, right ear: Secondary | ICD-10-CM | POA: Diagnosis not present

## 2023-11-17 DIAGNOSIS — H66001 Acute suppurative otitis media without spontaneous rupture of ear drum, right ear: Secondary | ICD-10-CM | POA: Diagnosis not present

## 2023-11-22 DIAGNOSIS — H6123 Impacted cerumen, bilateral: Secondary | ICD-10-CM | POA: Diagnosis not present

## 2023-11-22 DIAGNOSIS — H6991 Unspecified Eustachian tube disorder, right ear: Secondary | ICD-10-CM | POA: Diagnosis not present

## 2023-11-22 DIAGNOSIS — H65191 Other acute nonsuppurative otitis media, right ear: Secondary | ICD-10-CM | POA: Diagnosis not present

## 2023-12-13 DIAGNOSIS — G43109 Migraine with aura, not intractable, without status migrainosus: Secondary | ICD-10-CM | POA: Diagnosis not present

## 2023-12-16 DIAGNOSIS — M5416 Radiculopathy, lumbar region: Secondary | ICD-10-CM | POA: Diagnosis not present

## 2023-12-16 DIAGNOSIS — I10 Essential (primary) hypertension: Secondary | ICD-10-CM | POA: Diagnosis not present

## 2023-12-16 DIAGNOSIS — R7303 Prediabetes: Secondary | ICD-10-CM | POA: Diagnosis not present

## 2024-02-14 DIAGNOSIS — H6993 Unspecified Eustachian tube disorder, bilateral: Secondary | ICD-10-CM | POA: Diagnosis not present

## 2024-02-14 DIAGNOSIS — J309 Allergic rhinitis, unspecified: Secondary | ICD-10-CM | POA: Diagnosis not present

## 2024-02-14 DIAGNOSIS — J343 Hypertrophy of nasal turbinates: Secondary | ICD-10-CM | POA: Diagnosis not present

## 2024-04-24 DIAGNOSIS — R7303 Prediabetes: Secondary | ICD-10-CM | POA: Diagnosis not present

## 2024-04-24 DIAGNOSIS — K219 Gastro-esophageal reflux disease without esophagitis: Secondary | ICD-10-CM | POA: Diagnosis not present

## 2024-04-24 DIAGNOSIS — E782 Mixed hyperlipidemia: Secondary | ICD-10-CM | POA: Diagnosis not present

## 2024-04-24 DIAGNOSIS — I10 Essential (primary) hypertension: Secondary | ICD-10-CM | POA: Diagnosis not present

## 2024-07-09 ENCOUNTER — Emergency Department (HOSPITAL_BASED_OUTPATIENT_CLINIC_OR_DEPARTMENT_OTHER)
Admission: EM | Admit: 2024-07-09 | Discharge: 2024-07-09 | Disposition: A | Discharge status: Home / Self Care | Attending: Emergency Medicine | Admitting: Emergency Medicine

## 2024-07-09 ENCOUNTER — Encounter (HOSPITAL_BASED_OUTPATIENT_CLINIC_OR_DEPARTMENT_OTHER): Payer: Self-pay

## 2024-07-09 ENCOUNTER — Other Ambulatory Visit: Payer: Self-pay

## 2024-07-09 DIAGNOSIS — I10 Essential (primary) hypertension: Secondary | ICD-10-CM | POA: Insufficient documentation

## 2024-07-09 DIAGNOSIS — M5441 Lumbago with sciatica, right side: Secondary | ICD-10-CM | POA: Diagnosis not present

## 2024-07-09 DIAGNOSIS — Z79899 Other long term (current) drug therapy: Secondary | ICD-10-CM | POA: Diagnosis not present

## 2024-07-09 DIAGNOSIS — M5440 Lumbago with sciatica, unspecified side: Secondary | ICD-10-CM | POA: Diagnosis present

## 2024-07-09 MED ORDER — METHYLPREDNISOLONE 4 MG PO TBPK: ORAL_TABLET | ORAL | 0 refills | Status: AC

## 2024-07-09 MED ORDER — KETOROLAC TROMETHAMINE 30 MG/ML IJ SOLN
30.0000 mg | Freq: Once | INTRAMUSCULAR | Status: AC
  Administered 2024-07-09: 30 mg via INTRAMUSCULAR
  Filled 2024-07-09: qty 1

## 2024-07-09 MED ORDER — CYCLOBENZAPRINE HCL 5 MG PO TABS: 5.0000 mg | ORAL_TABLET | Freq: Two times a day (BID) | ORAL | 0 refills | Status: AC | PRN

## 2024-07-09 MED ORDER — CYCLOBENZAPRINE HCL 5 MG PO TABS
5.0000 mg | ORAL_TABLET | Freq: Once | ORAL | Status: AC
  Administered 2024-07-09: 5 mg via ORAL
  Filled 2024-07-09: qty 1

## 2024-07-09 NOTE — Discharge Instructions (Signed)
 You were seen today for back pain.  You likely have a nerve component called sciatica.  Take medications as prescribed.  Make sure that you are stretching appropriately.  Follow-up with your primary doctor as you may benefit from physical therapy.

## 2024-07-09 NOTE — ED Triage Notes (Signed)
 Pt presents via POV c/o lower back pain extending into right leg that started this am. Reports hx of sciatica.

## 2024-07-09 NOTE — ED Provider Notes (Signed)
 [place] EMERGENCY DEPARTMENT AT [facility] Provider Note   CSN: [id number] Arrival date & time: 07/09/24  9567     Patient presents with: Back Pain   Carl Greene is a 48 y.o. male.   HPI     This is a 48 year old male who presents with back pain.  Patient reports several day history of back pain.  He states he woke up to go the bathroom this morning and had significant increase in pain.  He reports that the pain is across his lower back and radiates down his right buttock into his right knee.  He does report a remote injury from his prior job but no recent injury or heavy lifting.  Denies weakness, numbness, tingling of the lower extremity.  Denies bowel or bladder difficulty.  Denies history of fever, cancer, drug use.  Prior to Admission medications   Medication Sig Start Date End Date Taking? Authorizing Provider  cyclobenzaprine  (FLEXERIL ) 5 MG tablet Take 1 tablet (5 mg total) by mouth 2 (two) times daily as needed for muscle spasms. 07/09/24  Yes Shriley Joffe, Charmaine FALCON, MD  methylPREDNISolone  (MEDROL  DOSEPAK) 4 MG TBPK tablet Take as directed on packet 07/09/24  Yes Vala Raffo, Charmaine FALCON, MD  amLODipine  (NORVASC ) 10 MG tablet Take 1 tablet by mouth once daily 07/05/23   Lorren Greig PARAS, NP  amLODipine  (NORVASC ) 10 MG tablet Take 1 tablet (10 mg total) by mouth daily. 07/05/23   Lorren Greig PARAS, NP  atorvastatin  (LIPITOR) 20 MG tablet Take 1 tablet by mouth once daily 04/19/23   Lorren Greig PARAS, NP  lidocaine  (LIDODERM ) 5 % Place 1 patch onto the skin daily. Remove & Discard patch within 12 hours or as directed by MD 08/20/22   Smoot, Lauraine DELENA, PA-C  methocarbamol  (ROBAXIN ) 500 MG tablet Take 1 tablet (500 mg total) by mouth 2 (two) times daily as needed for muscle spasms. 11/25/22   Hazen Darryle BRAVO, FNP  omeprazole  (PRILOSEC) 20 MG capsule Take 1 capsule (20 mg total) by mouth daily. 01/14/23 04/14/23  Lorren Greig PARAS, NP  oxyCODONE  (ROXICODONE ) 5 MG immediate release tablet Take  1-2 tablets (5-10 mg total) by mouth every 4 (four) hours as needed for severe pain (pain score 7-10) or breakthrough pain. 09/05/23   Clark, Meghan R, PA-C  valsartan  (DIOVAN ) 40 MG tablet Take 1 tablet by mouth once daily 07/05/23   Lorren Greig PARAS, NP  valsartan  (DIOVAN ) 40 MG tablet Take 1 tablet (40 mg total) by mouth daily. 07/05/23   Lorren Greig PARAS, NP    Allergies: Patient has no known allergies.    Review of Systems  Constitutional:  Negative for fever.  Musculoskeletal:  Positive for back pain.  Neurological:  Negative for weakness and numbness.  All other systems reviewed and are negative.   Updated Vital Signs BP (!) 134/96   Pulse 83   Temp 98.3 F (36.8 C) (Oral)   Resp 18   SpO2 100%   Physical Exam Vitals and nursing note reviewed.  Constitutional:      Appearance: He is well-developed. He is not ill-appearing.  HENT:     Head: Normocephalic and atraumatic.  Eyes:     Pupils: Pupils are equal, round, and reactive to light.  Cardiovascular:     Rate and Rhythm: Normal rate and regular rhythm.  Pulmonary:     Effort: Pulmonary effort is normal. No respiratory distress.  Abdominal:     Palpations: Abdomen is soft.  Tenderness: There is no abdominal tenderness.  Musculoskeletal:     Cervical back: Neck supple.     Comments: Tenderness to palpation along the paraspinous musculature of the lower lumbar spine, tenderness into the right piriformis region  Lymphadenopathy:     Cervical: No cervical adenopathy.  Skin:    General: Skin is warm and dry.  Neurological:     Mental Status: He is alert and oriented to person, place, and time.     Comments: 5 out of 5 strength bilateral lower extremities, equal patellar reflexes bilaterally, no clonus  Psychiatric:        Mood and Affect: Mood normal.     (all labs ordered are listed, but only abnormal results are displayed) Labs Reviewed - No data to display  EKG: None  Radiology: No results  found.   Procedures   Medications Ordered in the ED  ketorolac  (TORADOL ) 30 MG/ML injection 30 mg (has no administration in time range)  cyclobenzaprine  (FLEXERIL ) tablet 5 mg (has no administration in time range)                                    Medical Decision Making Risk Prescription drug management.   This patient presents to the ED for concern of back pain, this involves an extensive number of treatment options, and is a complaint that carries with it a high risk of complications and morbidity.  I considered the following differential and admission for this acute, potentially life threatening condition.  The differential diagnosis includes muscle strain, sciatica, less likely fracture  MDM:    This is a 48 year old male who presents with back pain.  He is nontoxic and vital signs are reassuring.  History and physical exam is suggestive of musculoskeletal etiology with a component of sciatica.  No red flags.  No signs or symptoms of cauda equina.  Do not feel imaging is indicated.  Patient given Toradol  and Flexeril .  Recommend a Medrol  dose pack for anti-inflammatory effect and muscle relaxants.  We discussed stretching.  Follow-up primary doctor for PT referral.  (Labs, imaging, consults)  Labs: I Ordered, and personally interpreted labs.  The pertinent results include: None  Imaging Studies ordered: I ordered imaging studies including none I independently visualized and interpreted imaging. I agree with the radiologist interpretation  Additional history obtained from chart review.  External records from outside source obtained and reviewed including prior evaluations  Cardiac Monitoring: The patient was not maintained on a cardiac monitor.  If on the cardiac monitor, I personally viewed and interpreted the cardiac monitored which showed an underlying rhythm of: N/A  Reevaluation: After the interventions noted above, I reevaluated the patient and found that they have  :improved  Social Determinants of Health:  lives independently  Disposition: Discharge  Co morbidities that complicate the patient evaluation  Past Medical History:  Diagnosis Date   Chest pain    Echo EF 50-55%. [facility] study 4/11: walked 9:15 no ecg changes. EF 42%?  inferoir attenuation no scar.    Dyspnea    GERD (gastroesophageal reflux disease)    HTN (hypertension)    Hx of gastric ulcer    Lightheadedness    with CP     Medicines Meds ordered this encounter  Medications   ketorolac  (TORADOL ) 30 MG/ML injection 30 mg   cyclobenzaprine  (FLEXERIL ) tablet 5 mg   methylPREDNISolone  (MEDROL  DOSEPAK) 4 MG TBPK tablet  Sig: Take as directed on packet    Dispense:  21 tablet    Refill:  0   cyclobenzaprine  (FLEXERIL ) 5 MG tablet    Sig: Take 1 tablet (5 mg total) by mouth 2 (two) times daily as needed for muscle spasms.    Dispense:  10 tablet    Refill:  0    I have reviewed the patients home medicines and have made adjustments as needed  Problem List / ED Course: Problem List Items Addressed This Visit       Other   Low back pain - Primary   Relevant Medications   ketorolac  (TORADOL ) 30 MG/ML injection 30 mg (Start on 07/09/2024  5:00 AM)   methylPREDNISolone  (MEDROL  DOSEPAK) 4 MG TBPK tablet   cyclobenzaprine  (FLEXERIL ) 5 MG tablet             Final diagnoses:  Acute bilateral low back pain with right-sided sciatica    ED Discharge Orders          Ordered    methylPREDNISolone  (MEDROL  DOSEPAK) 4 MG TBPK tablet        07/09/24 0456    cyclobenzaprine  (FLEXERIL ) 5 MG tablet  2 times daily PRN        07/09/24 0456               Bari Charmaine FALCON, MD 07/09/24 [phone]
